# Patient Record
Sex: Female | Born: 1945
Health system: Southern US, Community
[De-identification: ages and names within clinical notes are randomized; demographics above are authoritative.]

## PROBLEM LIST (undated history)

## (undated) DIAGNOSIS — I10 Essential (primary) hypertension: Secondary | ICD-10-CM

## (undated) DIAGNOSIS — E119 Type 2 diabetes mellitus without complications: Secondary | ICD-10-CM

## (undated) DIAGNOSIS — Z87442 Personal history of urinary calculi: Secondary | ICD-10-CM

## (undated) DIAGNOSIS — Z9889 Other specified postprocedural states: Secondary | ICD-10-CM

## (undated) DIAGNOSIS — R112 Nausea with vomiting, unspecified: Secondary | ICD-10-CM

## (undated) DIAGNOSIS — N189 Chronic kidney disease, unspecified: Secondary | ICD-10-CM

## (undated) HISTORY — PX: TUBAL LIGATION: SHX77

---

## 2012-11-24 ENCOUNTER — Other Ambulatory Visit: Payer: Self-pay | Admitting: Internal Medicine

## 2012-11-24 DIAGNOSIS — Z1231 Encounter for screening mammogram for malignant neoplasm of breast: Secondary | ICD-10-CM

## 2012-12-21 ENCOUNTER — Ambulatory Visit
Admission: RE | Admit: 2012-12-21 | Discharge: 2012-12-21 | Disposition: A | Discharge status: Home / Self Care | Payer: Medicare HMO | Source: Ambulatory Visit | Attending: Internal Medicine | Admitting: Internal Medicine

## 2012-12-21 DIAGNOSIS — Z1231 Encounter for screening mammogram for malignant neoplasm of breast: Secondary | ICD-10-CM

## 2013-03-13 ENCOUNTER — Other Ambulatory Visit: Payer: Self-pay | Admitting: Gastroenterology

## 2013-04-21 ENCOUNTER — Encounter (HOSPITAL_COMMUNITY): Payer: Self-pay | Admitting: *Deleted

## 2013-04-25 ENCOUNTER — Encounter (HOSPITAL_COMMUNITY): Payer: Self-pay | Admitting: Pharmacy Technician

## 2013-05-16 ENCOUNTER — Ambulatory Visit (HOSPITAL_COMMUNITY)
Admission: RE | Admit: 2013-05-16 | Discharge: 2013-05-16 | Disposition: A | Discharge status: Home / Self Care | Payer: Medicare HMO | Source: Ambulatory Visit | Attending: Gastroenterology | Admitting: Gastroenterology

## 2013-05-16 ENCOUNTER — Encounter (HOSPITAL_COMMUNITY): Payer: Self-pay | Admitting: *Deleted

## 2013-05-16 ENCOUNTER — Encounter (HOSPITAL_COMMUNITY)
Admission: RE | Disposition: A | Discharge status: Home / Self Care | Payer: Self-pay | Source: Ambulatory Visit | Attending: Gastroenterology

## 2013-05-16 ENCOUNTER — Ambulatory Visit (HOSPITAL_COMMUNITY): Payer: Medicare HMO | Admitting: Anesthesiology

## 2013-05-16 ENCOUNTER — Encounter (HOSPITAL_COMMUNITY): Payer: Self-pay | Admitting: Anesthesiology

## 2013-05-16 DIAGNOSIS — K5730 Diverticulosis of large intestine without perforation or abscess without bleeding: Secondary | ICD-10-CM | POA: Insufficient documentation

## 2013-05-16 DIAGNOSIS — Z1211 Encounter for screening for malignant neoplasm of colon: Secondary | ICD-10-CM | POA: Insufficient documentation

## 2013-05-16 DIAGNOSIS — E119 Type 2 diabetes mellitus without complications: Secondary | ICD-10-CM | POA: Insufficient documentation

## 2013-05-16 DIAGNOSIS — I10 Essential (primary) hypertension: Secondary | ICD-10-CM | POA: Insufficient documentation

## 2013-05-16 HISTORY — DX: Other specified postprocedural states: Z98.890

## 2013-05-16 HISTORY — DX: Chronic kidney disease, unspecified: N18.9

## 2013-05-16 HISTORY — DX: Essential (primary) hypertension: I10

## 2013-05-16 HISTORY — DX: Personal history of urinary calculi: Z87.442

## 2013-05-16 HISTORY — DX: Nausea with vomiting, unspecified: R11.2

## 2013-05-16 HISTORY — DX: Type 2 diabetes mellitus without complications: E11.9

## 2013-05-16 HISTORY — PX: COLONOSCOPY: SHX5424

## 2013-05-16 SURGERY — COLONOSCOPY
Anesthesia: Monitor Anesthesia Care

## 2013-05-16 MED ORDER — KETAMINE HCL 50 MG/ML IJ SOLN
INTRAMUSCULAR | Status: DC | PRN
  Administered 2013-05-16: 25 mg via INTRAMUSCULAR

## 2013-05-16 MED ORDER — PROPOFOL 10 MG/ML IV EMUL
INTRAVENOUS | Status: DC | PRN
  Administered 2013-05-16: 30 mg via INTRAVENOUS

## 2013-05-16 MED ORDER — FENTANYL CITRATE 0.05 MG/ML IJ SOLN: 25.0000 ug | INTRAMUSCULAR | Status: DC | PRN

## 2013-05-16 MED ORDER — LACTATED RINGERS IV SOLN
INTRAVENOUS | Status: DC
  Administered 2013-05-16: 1000 mL via INTRAVENOUS

## 2013-05-16 MED ORDER — LACTATED RINGERS IV SOLN
INTRAVENOUS | Status: DC | PRN
  Administered 2013-05-16: 08:00:00 via INTRAVENOUS

## 2013-05-16 MED ORDER — SODIUM CHLORIDE 0.9 % IV SOLN: INTRAVENOUS | Status: DC

## 2013-05-16 MED ORDER — PROPOFOL INFUSION 10 MG/ML OPTIME
INTRAVENOUS | Status: DC | PRN
  Administered 2013-05-16: 100 ug/kg/min via INTRAVENOUS

## 2013-05-16 NOTE — Preoperative (Signed)
Beta Blockers   Reason not to administer Beta Blockers:Not Applicable 

## 2013-05-16 NOTE — Anesthesia Preprocedure Evaluation (Addendum)
Anesthesia Evaluation  Patient identified by MRN, date of birth, ID band Patient awake    Reviewed: Allergy & Precautions, H&P , NPO status , Patient's Chart, lab work & pertinent test results, reviewed documented beta blocker date and time   History of Anesthesia Complications (+) PONV  Airway Mallampati: II TM Distance: >3 FB Neck ROM: full    Dental no notable dental hx. (+) Teeth Intact and Dental Advisory Given   Pulmonary neg pulmonary ROS,  breath sounds clear to auscultation  Pulmonary exam normal       Cardiovascular Exercise Tolerance: Good hypertension, Pt. on medications Rhythm:regular Rate:Normal     Neuro/Psych negative neurological ROS  negative psych ROS   GI/Hepatic negative GI ROS, Neg liver ROS,   Endo/Other  diabetes, Well Controlled, Type 2, Oral Hypoglycemic Agents  Renal/GU negative Renal ROS  negative genitourinary   Musculoskeletal   Abdominal   Peds  Hematology negative hematology ROS (+)   Anesthesia Other Findings   Reproductive/Obstetrics negative OB ROS                          Anesthesia Physical Anesthesia Plan  ASA: III  Anesthesia Plan: MAC   Post-op Pain Management:    Induction:   Airway Management Planned: Simple Face Mask  Additional Equipment:   Intra-op Plan:   Post-operative Plan:   Informed Consent: I have reviewed the patients History and Physical, chart, labs and discussed the procedure including the risks, benefits and alternatives for the proposed anesthesia with the patient or authorized representative who has indicated his/her understanding and acceptance.   Dental Advisory Given  Plan Discussed with: CRNA and Surgeon  Anesthesia Plan Comments:         Anesthesia Quick Evaluation

## 2013-05-16 NOTE — H&P (Signed)
  Procedure: Baseline screening colonoscopy  History: The patient is a 67 year old female born 09-11-1946. She is scheduled to undergo her first screening colonoscopy with polypectomy to prevent colon cancer.  Medication allergies: Penicillin caused rash. Codeine caused lightheadedness.  Past medical history: Hypertension. Type 2 diabetes mellitus. Tubal ligation.  Habits: The patient has never smoked cigarettes. She does not consume alcohol.  Exam: The patient is alert and lying comfortably on the endoscopy stretcher. Cardiac exam reveals a regular rhythm. Lungs are clear to auscultation. Abdomen is soft and nontender to palpation.  Plan: Proceed with baseline screening colonoscopy

## 2013-05-16 NOTE — Transfer of Care (Signed)
Immediate Anesthesia Transfer of Care Note  Patient: Wendy Fields  Procedure(s) Performed: Procedure(s): COLONOSCOPY (N/A)  Patient Location: PACU  Anesthesia Type:MAC  Level of Consciousness: awake, alert  and oriented  Airway & Oxygen Therapy: Patient Spontanous Breathing and Patient connected to face mask oxygen  Post-op Assessment: Report given to PACU RN and Post -op Vital signs reviewed and stable  Post vital signs: Reviewed and stable  Complications: No apparent anesthesia complications

## 2013-05-16 NOTE — Anesthesia Postprocedure Evaluation (Addendum)
  Anesthesia Post-op Note  Patient: Wendy Fields  Procedure(s) Performed: Procedure(s) (LRB): COLONOSCOPY (N/A)  Patient Location: PACU  Anesthesia Type: MAC  Level of Consciousness: awake and alert   Airway and Oxygen Therapy: Patient Spontanous Breathing  Post-op Pain: mild  Post-op Assessment: Post-op Vital signs reviewed, Patient's Cardiovascular Status Stable, Respiratory Function Stable, Patent Airway and No signs of Nausea or vomiting  Last Vitals:  Filed Vitals:   05/16/13 0932  BP: 169/72  Temp:   Resp: 21    Post-op Vital Signs: stable   Complications: No apparent anesthesia complications

## 2013-05-16 NOTE — Op Note (Signed)
Procedure: Baseline screening colonoscopy  Endoscopist: Danise Edge  Premedication: Propofol administered by anesthesia  Procedure: The patient was placed in the left lateral decubitus position. Anal inspection and digital rectal exam were normal. The Pentax pediatric colonoscope was introduced into the rectum and advanced to the cecum. A normal-appearing appendiceal orifice and ileocecal valve were identified. Colonic preparation for the exam today was good.  Rectum. Normal. Retroflexed view of the distal rectum normal.  Sigmoid colon and descending colon. Left colonic diverticulosis  Splenic flexure. Normal.  Transverse colon. Normal.  Hepatic flexure. Normal.  Ascending colon. Normal.  Cecum and ileocecal valve. Normal.  Assessment: Normal baseline screening colonoscopy  Recommendations: Schedule repeat screening colonoscopy in 10 years.

## 2013-05-17 ENCOUNTER — Encounter (HOSPITAL_COMMUNITY): Payer: Self-pay | Admitting: Gastroenterology

## 2014-01-05 ENCOUNTER — Other Ambulatory Visit: Payer: Self-pay | Admitting: Internal Medicine

## 2014-01-05 DIAGNOSIS — Z1231 Encounter for screening mammogram for malignant neoplasm of breast: Secondary | ICD-10-CM

## 2014-01-26 ENCOUNTER — Ambulatory Visit
Admission: RE | Admit: 2014-01-26 | Discharge: 2014-01-26 | Disposition: A | Discharge status: Home / Self Care | Payer: Medicare HMO | Source: Ambulatory Visit | Attending: Internal Medicine | Admitting: Internal Medicine

## 2014-01-26 ENCOUNTER — Encounter (INDEPENDENT_AMBULATORY_CARE_PROVIDER_SITE_OTHER): Payer: Self-pay

## 2014-01-26 DIAGNOSIS — Z1231 Encounter for screening mammogram for malignant neoplasm of breast: Secondary | ICD-10-CM

## 2015-06-25 ENCOUNTER — Encounter (HOSPITAL_COMMUNITY): Payer: Self-pay | Admitting: Emergency Medicine

## 2015-06-25 ENCOUNTER — Emergency Department (HOSPITAL_COMMUNITY)
Admission: EM | Admit: 2015-06-25 | Discharge: 2015-06-25 | Disposition: A | Discharge status: Home / Self Care | Payer: Medicare PPO | Attending: Emergency Medicine | Admitting: Emergency Medicine

## 2015-06-25 DIAGNOSIS — R109 Unspecified abdominal pain: Secondary | ICD-10-CM | POA: Diagnosis present

## 2015-06-25 DIAGNOSIS — E119 Type 2 diabetes mellitus without complications: Secondary | ICD-10-CM | POA: Diagnosis not present

## 2015-06-25 DIAGNOSIS — Z79899 Other long term (current) drug therapy: Secondary | ICD-10-CM | POA: Insufficient documentation

## 2015-06-25 DIAGNOSIS — N23 Unspecified renal colic: Secondary | ICD-10-CM

## 2015-06-25 DIAGNOSIS — Z87442 Personal history of urinary calculi: Secondary | ICD-10-CM | POA: Insufficient documentation

## 2015-06-25 DIAGNOSIS — Z7982 Long term (current) use of aspirin: Secondary | ICD-10-CM | POA: Insufficient documentation

## 2015-06-25 DIAGNOSIS — Z880 Allergy status to penicillin: Secondary | ICD-10-CM | POA: Diagnosis not present

## 2015-06-25 DIAGNOSIS — N189 Chronic kidney disease, unspecified: Secondary | ICD-10-CM | POA: Insufficient documentation

## 2015-06-25 DIAGNOSIS — I129 Hypertensive chronic kidney disease with stage 1 through stage 4 chronic kidney disease, or unspecified chronic kidney disease: Secondary | ICD-10-CM | POA: Insufficient documentation

## 2015-06-25 LAB — CBC WITH DIFFERENTIAL/PLATELET
Basophils Absolute: 0 10*3/uL (ref 0.0–0.1)
Basophils Relative: 0 %
EOS PCT: 0 %
Eosinophils Absolute: 0 10*3/uL (ref 0.0–0.7)
HEMATOCRIT: 43.3 % (ref 36.0–46.0)
HEMOGLOBIN: 13.8 g/dL (ref 12.0–15.0)
LYMPHS ABS: 1.2 10*3/uL (ref 0.7–4.0)
LYMPHS PCT: 10 %
MCH: 29.9 pg (ref 26.0–34.0)
MCHC: 31.9 g/dL (ref 30.0–36.0)
MCV: 93.9 fL (ref 78.0–100.0)
Monocytes Absolute: 0.5 10*3/uL (ref 0.1–1.0)
Monocytes Relative: 4 %
NEUTROS ABS: 10.1 10*3/uL — AB (ref 1.7–7.7)
NEUTROS PCT: 86 %
Platelets: 241 10*3/uL (ref 150–400)
RBC: 4.61 MIL/uL (ref 3.87–5.11)
RDW: 13.5 % (ref 11.5–15.5)
WBC: 11.9 10*3/uL — AB (ref 4.0–10.5)

## 2015-06-25 LAB — COMPREHENSIVE METABOLIC PANEL
ALK PHOS: 64 U/L (ref 38–126)
ALT: 17 U/L (ref 14–54)
AST: 23 U/L (ref 15–41)
Albumin: 4.6 g/dL (ref 3.5–5.0)
Anion gap: 9 (ref 5–15)
BUN: 15 mg/dL (ref 6–20)
CALCIUM: 9.6 mg/dL (ref 8.9–10.3)
CO2: 27 mmol/L (ref 22–32)
CREATININE: 1.06 mg/dL — AB (ref 0.44–1.00)
Chloride: 104 mmol/L (ref 101–111)
GFR, EST NON AFRICAN AMERICAN: 53 mL/min — AB (ref >60)
Glucose, Bld: 183 mg/dL — ABNORMAL HIGH (ref 65–99)
Potassium: 3.5 mmol/L (ref 3.5–5.1)
Sodium: 140 mmol/L (ref 135–145)
Total Bilirubin: 0.8 mg/dL (ref 0.3–1.2)
Total Protein: 7.6 g/dL (ref 6.5–8.1)

## 2015-06-25 LAB — URINE MICROSCOPIC-ADD ON

## 2015-06-25 LAB — URINALYSIS, ROUTINE W REFLEX MICROSCOPIC
Bilirubin Urine: NEGATIVE
Glucose, UA: 250 mg/dL — AB
Ketones, ur: >80 mg/dL — AB
Nitrite: NEGATIVE
Protein, ur: NEGATIVE mg/dL
SPECIFIC GRAVITY, URINE: 1.016 (ref 1.005–1.030)
UROBILINOGEN UA: 0.2 mg/dL (ref 0.0–1.0)
pH: 7.5 (ref 5.0–8.0)

## 2015-06-25 MED ORDER — HYDROCODONE-ACETAMINOPHEN 5-325 MG PO TABS: ORAL_TABLET | ORAL | Status: DC

## 2015-06-25 MED ORDER — ONDANSETRON HCL 4 MG PO TABS: 4.0000 mg | ORAL_TABLET | Freq: Three times a day (TID) | ORAL | Status: DC | PRN

## 2015-06-25 NOTE — Discharge Instructions (Signed)
Please follow with your primary care doctor in the next 2 days for a check-up. They must obtain records for further management.  ° °Do not hesitate to return to the Emergency Department for any new, worsening or concerning symptoms.  ° °Take vicodin for breakthrough pain, do not drink alcohol, drive, care for children or do other critical tasks while taking vicodin. ° ° °Kidney Stones °Kidney stones (urolithiasis) are deposits that form inside your kidneys. The intense pain is caused by the stone moving through the urinary tract. When the stone moves, the ureter goes into spasm around the stone. The stone is usually passed in the urine.  °CAUSES  °· A disorder that makes certain neck glands produce too much parathyroid hormone (primary hyperparathyroidism). °· A buildup of uric acid crystals, similar to gout in your joints. °· Narrowing (stricture) of the ureter. °· A kidney obstruction present at birth (congenital obstruction). °· Previous surgery on the kidney or ureters. °· Numerous kidney infections. °SYMPTOMS  °· Feeling sick to your stomach (nauseous). °· Throwing up (vomiting). °· Blood in the urine (hematuria). °· Pain that usually spreads (radiates) to the groin. °· Frequency or urgency of urination. °DIAGNOSIS  °· Taking a history and physical exam. °· Blood or urine tests. °· CT scan. °· Occasionally, an examination of the inside of the urinary bladder (cystoscopy) is performed. °TREATMENT  °· Observation. °· Increasing your fluid intake. °· Extracorporeal shock wave lithotripsy--This is a noninvasive procedure that uses shock waves to break up kidney stones. °· Surgery may be needed if you have severe pain or persistent obstruction. There are various surgical procedures. Most of the procedures are performed with the use of small instruments. Only small incisions are needed to accommodate these instruments, so recovery time is minimized. °The size, location, and chemical composition are all important  variables that will determine the proper choice of action for you. Talk to your health care provider to better understand your situation so that you will minimize the risk of injury to yourself and your kidney.  °HOME CARE INSTRUCTIONS  °· Drink enough water and fluids to keep your urine clear or pale yellow. This will help you to pass the stone or stone fragments. °· Strain all urine through the provided strainer. Keep all particulate matter and stones for your health care provider to see. The stone causing the pain may be as small as a grain of salt. It is very important to use the strainer each and every time you pass your urine. The collection of your stone will allow your health care provider to analyze it and verify that a stone has actually passed. The stone analysis will often identify what you can do to reduce the incidence of recurrences. °· Only take over-the-counter or prescription medicines for pain, discomfort, or fever as directed by your health care provider. °· Make a follow-up appointment with your health care provider as directed. °· Get follow-up X-rays if required. The absence of pain does not always mean that the stone has passed. It may have only stopped moving. If the urine remains completely obstructed, it can cause loss of kidney function or even complete destruction of the kidney. It is your responsibility to make sure X-rays and follow-ups are completed. Ultrasounds of the kidney can show blockages and the status of the kidney. Ultrasounds are not associated with any radiation and can be performed easily in a matter of minutes. °SEEK MEDICAL CARE IF: °· You experience pain that is progressive and unresponsive   to any pain medicine you have been prescribed. °SEEK IMMEDIATE MEDICAL CARE IF:  °· Pain cannot be controlled with the prescribed medicine. °· You have a fever or shaking chills. °· The severity or intensity of pain increases over 18 hours and is not relieved by pain medicine. °· You  develop a new onset of abdominal pain. °· You feel faint or pass out. °· You are unable to urinate. °MAKE SURE YOU:  °· Understand these instructions. °· Will watch your condition. °· Will get help right away if you are not doing well or get worse. °Document Released: 09/07/2005 Document Revised: 05/10/2013 Document Reviewed: 02/08/2013 °[employer]® Patient Information ©2015 [employer]. This information is not intended to replace advice given to you by your health care provider. Make sure you discuss any questions you have with your health care provider. ° °

## 2015-06-25 NOTE — ED Notes (Signed)
Pt st's she had sudden onset of left flank pain radiating around to LLQ approx 2 pm today with nausea and vomiting.  Pt st's pain has subsided at this time.  Has hx of kidney stones

## 2015-06-25 NOTE — ED Provider Notes (Signed)
  Face-to-face evaluation   History: Patient had left flank pain typical of her usual discomfort with a kidney stone. The pain radiated to the left abdomen. Pain is completely resolved now. She has no other symptoms.  Physical exam: Alert, calm, comfortable, in no apparent distress. Back is nontender to palpation. She has normal range of motion of the back.    Medical screening examination/treatment/procedure(s) were conducted as a shared visit with non-physician practitioner(s) and myself.  I personally evaluated the patient during the encounter  Mancel Bale, MD 06/25/15 [phone]

## 2015-06-25 NOTE — ED Provider Notes (Signed)
CSN: [id number]     Arrival date & time 06/25/15  1537 History  By signing my name below, I, Ronney Lion, attest that this documentation has been prepared under the direction and in the presence of [employer], PA-C. Electronically Signed: Ronney Lion, ED Scribe. 06/25/2015. 5:54 PM.      Chief Complaint  Patient presents with  . Flank Pain   The history is provided by the patient. No language interpreter was used.    HPI Comments: Wendy Fields is a 69 y.o. female with a history of HTN, DM, history of kidney stones, who presents to the Emergency Department complaining of sudden-onset, constant, waxing and waning left flank pain that began about 4 hours ago. She rates her pain as a 2/10 at the present moment but a 7/10 at its highest. Patient also complains of associated nausea and vomiting because her pain was so severe. She states this feels like a kidney stone although she notes the last kidney stone she passed was 20 years ago. She states she was able to pass her kidney stones without any lithotripsy or medical interventions in the past. Patient had seen a urologist 20 years ago but has not seen one since her last stone. Patient states she does not feel as if she passed the stone yet. She denies hematuria, fever, dysuria, chest pain, SOB, or abdominal pain. Patient has known allergies to penicillin and codeine, which she states causes a "fuzzy-headed experience."   Past Medical History  Diagnosis Date  . Hypertension   . Diabetes mellitus without complication (HCC)   . History of kidney stones 10 to 15 yrs ago  . PONV (postoperative nausea and vomiting)     nausea only  . Chronic kidney disease     hx  kidney stone   Past Surgical History  Procedure Laterality Date  . Tubal ligation  yrs ago  . Colonoscopy N/A 05/16/2013    Procedure: COLONOSCOPY;  Surgeon: Charolett Bumpers, MD;  Location: [facility];  Service: Endoscopy;  Laterality: N/A;   No family history on file. Social  History  Substance Use Topics  . Smoking status: Never Smoker   . Smokeless tobacco: Never Used  . Alcohol Use: No   OB History    No data available     Review of Systems  A complete 10 system review of systems was obtained and all systems are negative except as noted in the HPI and PMH.    Allergies  Codeine and Penicillins  Home Medications   Prior to Admission medications   Medication Sig Start Date End Date Taking? Authorizing Provider  aspirin EC 81 MG tablet Take 81 mg by mouth daily.    Historical Provider, MD  Biotin (BIOTIN 5000) 5 MG CAPS Take 1 capsule by mouth daily.    Historical Provider, MD  calcium-vitamin D (OSCAL WITH D) 500-200 MG-UNIT per tablet Take 1 tablet by mouth 2 (two) times daily.    Historical Provider, MD  HYDROcodone-acetaminophen (NORCO/VICODIN) 5-325 MG tablet Take 1-2 tablets by mouth every 6 hours as needed for pain and/or cough. 06/25/15   Mateo Overbeck, PA-C  losartan (COZAAR) 50 MG tablet Take 50 mg by mouth every evening.    Historical Provider, MD  metFORMIN (GLUCOPHAGE) 500 MG tablet Take 500 mg by mouth every evening.    Historical Provider, MD  ondansetron (ZOFRAN) 4 MG tablet Take 1 tablet (4 mg total) by mouth every 8 (eight) hours as needed for nausea or vomiting.  06/25/15   Kamylle Axelson, PA-C  vitamin E 400 UNIT capsule Take 400 Units by mouth daily.    Historical Provider, MD   BP 119/55 mmHg  Pulse 64  Temp(Src) 97.9 F (36.6 C) (Oral)  Resp 18  SpO2 94% Physical Exam  Constitutional: She is oriented to person, place, and time. She appears well-developed and well-nourished. No distress.  HENT:  Head: Normocephalic and atraumatic.  Mouth/Throat: Oropharynx is clear and moist.  Eyes: Conjunctivae and EOM are normal. Pupils are equal, round, and reactive to light.  Neck: Normal range of motion. Neck supple. No tracheal deviation present.  Cardiovascular: Normal rate, regular rhythm and intact distal pulses.    Pulmonary/Chest: Effort normal and breath sounds normal. No stridor. No respiratory distress.  Abdominal: Soft. Bowel sounds are normal. She exhibits no distension and no mass. There is no tenderness. There is no rebound and no guarding.  Genitourinary:  No CVA tenderness to percussion bilaterally  Musculoskeletal: Normal range of motion.  Neurological: She is alert and oriented to person, place, and time.  Skin: Skin is warm and dry. She is not diaphoretic.  Psychiatric: She has a normal mood and affect. Her behavior is normal.  Nursing note and vitals reviewed.   ED Course  Procedures (including critical care time)  DIAGNOSTIC STUDIES: Oxygen Saturation is 99% on RA, normal by my interpretation.    COORDINATION OF CARE: 5:54 PM - Discussed treatment plan with pt at bedside which includes Rx pain medication and nausea medication upon discharge. Pt declines any pain medication while here. Return precautions given. Pt verbalized understanding and agreed to plan.   Labs Review Labs Reviewed  URINALYSIS, ROUTINE W REFLEX MICROSCOPIC (NOT AT [facility]) - Abnormal; Notable for the following:    APPearance HAZY (*)    Glucose, UA 250 (*)    Hgb urine dipstick MODERATE (*)    Ketones, ur >80 (*)    Leukocytes, UA TRACE (*)    All other components within normal limits  CBC WITH DIFFERENTIAL/PLATELET - Abnormal; Notable for the following:    WBC 11.9 (*)    Neutro Abs 10.1 (*)    All other components within normal limits  COMPREHENSIVE METABOLIC PANEL - Abnormal; Notable for the following:    Glucose, Bld 183 (*)    Creatinine, Ser 1.06 (*)    GFR calc non Af Amer 53 (*)    All other components within normal limits  URINE MICROSCOPIC-ADD ON - Abnormal; Notable for the following:    Squamous Epithelial / LPF FEW (*)    Bacteria, UA FEW (*)    All other components within normal limits    MDM   Final diagnoses:  Renal colic on left side    Filed Vitals:   06/25/15 1614 06/25/15  1814  BP: 115/52 119/55  Pulse: 68 64  Temp: 97.9 F (36.6 C)   TempSrc: Oral   Resp: 20 18  SpO2: 99% 94%     Wendy Fields is a pleasant 69 y.o. female presenting with left flank pain radiating around to the anterior side was severe, causing her to vomit. This is consistent with her prior episodes of kidney stones. She's never required any intervention to pass the stones and the pain has largely resolved at this point. This is likely a stone that has passed. there is any indication for imaging at this time. Urinalysis is not consistent with infection, patient declines any pain or nausea medication. Creatinine is slightly elevated at 1.06.  Ketones in the urine probably secondary to emesis. She is hyperglycemic with a normal anion gap. Patient will be written a prescription for Zofran and Vicodin if the pain is to continue. Patient is given a urine strainer and urology follow-up.  This is a shared visit with the attending physician who personally evaluated the patient and agrees with the care plan.   Evaluation does not show pathology that would require ongoing emergent intervention or inpatient treatment. Pt is hemodynamically stable and mentating appropriately. Discussed findings and plan with patient/guardian, who agrees with care plan. All questions answered. Return precautions discussed and outpatient follow up given.   New Prescriptions   HYDROCODONE-ACETAMINOPHEN (NORCO/VICODIN) 5-325 MG TABLET    Take 1-2 tablets by mouth every 6 hours as needed for pain and/or cough.   ONDANSETRON (ZOFRAN) 4 MG TABLET    Take 1 tablet (4 mg total) by mouth every 8 (eight) hours as needed for nausea or vomiting.     I personally performed the services described in this documentation, which was scribed in my presence. The recorded information has been reviewed and is accurate.     Wynetta Emery, PA-C 06/25/15 1826  Mancel Bale, MD 06/25/15 [phone]

## 2015-07-09 DIAGNOSIS — N200 Calculus of kidney: Secondary | ICD-10-CM | POA: Diagnosis not present

## 2015-07-09 DIAGNOSIS — E1122 Type 2 diabetes mellitus with diabetic chronic kidney disease: Secondary | ICD-10-CM | POA: Diagnosis not present

## 2015-07-09 DIAGNOSIS — I10 Essential (primary) hypertension: Secondary | ICD-10-CM | POA: Diagnosis not present

## 2015-07-09 DIAGNOSIS — Z23 Encounter for immunization: Secondary | ICD-10-CM | POA: Diagnosis not present

## 2015-07-09 DIAGNOSIS — M810 Age-related osteoporosis without current pathological fracture: Secondary | ICD-10-CM | POA: Diagnosis not present

## 2015-07-09 DIAGNOSIS — E785 Hyperlipidemia, unspecified: Secondary | ICD-10-CM | POA: Diagnosis not present

## 2015-07-09 DIAGNOSIS — N182 Chronic kidney disease, stage 2 (mild): Secondary | ICD-10-CM | POA: Diagnosis not present

## 2015-08-20 DIAGNOSIS — N201 Calculus of ureter: Secondary | ICD-10-CM | POA: Diagnosis not present

## 2016-01-08 ENCOUNTER — Other Ambulatory Visit: Payer: Self-pay | Admitting: Internal Medicine

## 2016-01-08 DIAGNOSIS — Z1231 Encounter for screening mammogram for malignant neoplasm of breast: Secondary | ICD-10-CM

## 2016-01-27 ENCOUNTER — Ambulatory Visit
Admission: RE | Admit: 2016-01-27 | Discharge: 2016-01-27 | Disposition: A | Discharge status: Home / Self Care | Payer: Medicare Other | Source: Ambulatory Visit | Attending: Internal Medicine | Admitting: Internal Medicine

## 2016-01-27 DIAGNOSIS — Z1231 Encounter for screening mammogram for malignant neoplasm of breast: Secondary | ICD-10-CM

## 2018-02-21 ENCOUNTER — Other Ambulatory Visit: Payer: Self-pay | Admitting: Internal Medicine

## 2018-02-21 DIAGNOSIS — Z1231 Encounter for screening mammogram for malignant neoplasm of breast: Secondary | ICD-10-CM

## 2018-03-18 ENCOUNTER — Ambulatory Visit
Admission: RE | Admit: 2018-03-18 | Discharge: 2018-03-18 | Disposition: A | Discharge status: Home / Self Care | Payer: Medicare Other | Source: Ambulatory Visit | Attending: Internal Medicine | Admitting: Internal Medicine

## 2018-03-18 ENCOUNTER — Ambulatory Visit: Payer: Self-pay

## 2018-03-18 DIAGNOSIS — Z1231 Encounter for screening mammogram for malignant neoplasm of breast: Secondary | ICD-10-CM

## 2019-03-22 ENCOUNTER — Other Ambulatory Visit: Payer: Self-pay | Admitting: Internal Medicine

## 2019-03-22 DIAGNOSIS — M810 Age-related osteoporosis without current pathological fracture: Secondary | ICD-10-CM

## 2019-03-23 ENCOUNTER — Other Ambulatory Visit: Payer: Self-pay | Admitting: Internal Medicine

## 2019-03-23 DIAGNOSIS — Z1231 Encounter for screening mammogram for malignant neoplasm of breast: Secondary | ICD-10-CM

## 2019-06-21 ENCOUNTER — Ambulatory Visit
Admission: RE | Admit: 2019-06-21 | Discharge: 2019-06-21 | Disposition: A | Discharge status: Home / Self Care | Payer: Medicare Other | Source: Ambulatory Visit | Attending: Internal Medicine | Admitting: Internal Medicine

## 2019-06-21 ENCOUNTER — Other Ambulatory Visit: Payer: Self-pay

## 2019-06-21 DIAGNOSIS — Z1231 Encounter for screening mammogram for malignant neoplasm of breast: Secondary | ICD-10-CM

## 2019-06-21 DIAGNOSIS — M810 Age-related osteoporosis without current pathological fracture: Secondary | ICD-10-CM

## 2020-07-08 DIAGNOSIS — J209 Acute bronchitis, unspecified: Secondary | ICD-10-CM | POA: Diagnosis not present

## 2020-07-08 DIAGNOSIS — R051 Acute cough: Secondary | ICD-10-CM | POA: Diagnosis not present

## 2020-07-08 DIAGNOSIS — J069 Acute upper respiratory infection, unspecified: Secondary | ICD-10-CM | POA: Diagnosis not present

## 2020-07-08 DIAGNOSIS — Z20828 Contact with and (suspected) exposure to other viral communicable diseases: Secondary | ICD-10-CM | POA: Diagnosis not present

## 2020-07-08 DIAGNOSIS — E1165 Type 2 diabetes mellitus with hyperglycemia: Secondary | ICD-10-CM | POA: Diagnosis not present

## 2020-07-30 DIAGNOSIS — Z20828 Contact with and (suspected) exposure to other viral communicable diseases: Secondary | ICD-10-CM | POA: Diagnosis not present

## 2020-08-27 DIAGNOSIS — E119 Type 2 diabetes mellitus without complications: Secondary | ICD-10-CM | POA: Diagnosis not present

## 2020-09-02 DIAGNOSIS — M810 Age-related osteoporosis without current pathological fracture: Secondary | ICD-10-CM | POA: Diagnosis not present

## 2020-09-02 DIAGNOSIS — R059 Cough, unspecified: Secondary | ICD-10-CM | POA: Diagnosis not present

## 2020-09-02 DIAGNOSIS — N182 Chronic kidney disease, stage 2 (mild): Secondary | ICD-10-CM | POA: Diagnosis not present

## 2020-09-02 DIAGNOSIS — E1122 Type 2 diabetes mellitus with diabetic chronic kidney disease: Secondary | ICD-10-CM | POA: Diagnosis not present

## 2020-09-02 DIAGNOSIS — Z7984 Long term (current) use of oral hypoglycemic drugs: Secondary | ICD-10-CM | POA: Diagnosis not present

## 2020-09-02 DIAGNOSIS — I10 Essential (primary) hypertension: Secondary | ICD-10-CM | POA: Diagnosis not present

## 2020-10-28 DIAGNOSIS — Z20828 Contact with and (suspected) exposure to other viral communicable diseases: Secondary | ICD-10-CM | POA: Diagnosis not present

## 2021-03-24 DIAGNOSIS — J209 Acute bronchitis, unspecified: Secondary | ICD-10-CM | POA: Diagnosis not present

## 2021-03-24 DIAGNOSIS — R059 Cough, unspecified: Secondary | ICD-10-CM | POA: Diagnosis not present

## 2021-03-24 DIAGNOSIS — J0100 Acute maxillary sinusitis, unspecified: Secondary | ICD-10-CM | POA: Diagnosis not present

## 2021-04-21 ENCOUNTER — Other Ambulatory Visit: Payer: Self-pay | Admitting: Internal Medicine

## 2021-04-21 DIAGNOSIS — Z1231 Encounter for screening mammogram for malignant neoplasm of breast: Secondary | ICD-10-CM

## 2021-04-24 ENCOUNTER — Other Ambulatory Visit: Payer: Self-pay

## 2021-04-24 ENCOUNTER — Ambulatory Visit
Admission: RE | Admit: 2021-04-24 | Discharge: 2021-04-24 | Disposition: A | Discharge status: Home / Self Care | Payer: Medicare PPO | Source: Ambulatory Visit | Attending: Internal Medicine | Admitting: Internal Medicine

## 2021-04-24 DIAGNOSIS — Z1231 Encounter for screening mammogram for malignant neoplasm of breast: Secondary | ICD-10-CM

## 2021-04-28 ENCOUNTER — Other Ambulatory Visit: Payer: Self-pay | Admitting: Internal Medicine

## 2021-04-28 DIAGNOSIS — R928 Other abnormal and inconclusive findings on diagnostic imaging of breast: Secondary | ICD-10-CM

## 2021-05-13 ENCOUNTER — Ambulatory Visit
Admission: RE | Admit: 2021-05-13 | Discharge: 2021-05-13 | Disposition: A | Discharge status: Home / Self Care | Payer: Medicare PPO | Source: Ambulatory Visit | Attending: Internal Medicine | Admitting: Internal Medicine

## 2021-05-13 ENCOUNTER — Other Ambulatory Visit: Payer: Self-pay

## 2021-05-13 ENCOUNTER — Ambulatory Visit: Payer: Medicare PPO

## 2021-05-13 DIAGNOSIS — R928 Other abnormal and inconclusive findings on diagnostic imaging of breast: Secondary | ICD-10-CM

## 2021-05-13 DIAGNOSIS — R922 Inconclusive mammogram: Secondary | ICD-10-CM | POA: Diagnosis not present

## 2021-05-20 DIAGNOSIS — I10 Essential (primary) hypertension: Secondary | ICD-10-CM | POA: Diagnosis not present

## 2021-05-20 DIAGNOSIS — Z1389 Encounter for screening for other disorder: Secondary | ICD-10-CM | POA: Diagnosis not present

## 2021-05-20 DIAGNOSIS — N182 Chronic kidney disease, stage 2 (mild): Secondary | ICD-10-CM | POA: Diagnosis not present

## 2021-05-20 DIAGNOSIS — R059 Cough, unspecified: Secondary | ICD-10-CM | POA: Diagnosis not present

## 2021-05-20 DIAGNOSIS — Z0000 Encounter for general adult medical examination without abnormal findings: Secondary | ICD-10-CM | POA: Diagnosis not present

## 2021-05-20 DIAGNOSIS — E1122 Type 2 diabetes mellitus with diabetic chronic kidney disease: Secondary | ICD-10-CM | POA: Diagnosis not present

## 2021-05-20 DIAGNOSIS — M810 Age-related osteoporosis without current pathological fracture: Secondary | ICD-10-CM | POA: Diagnosis not present

## 2021-05-20 DIAGNOSIS — E7800 Pure hypercholesterolemia, unspecified: Secondary | ICD-10-CM | POA: Diagnosis not present

## 2021-06-20 ENCOUNTER — Institutional Professional Consult (permissible substitution): Payer: Medicare PPO | Admitting: Emergency Medicine

## 2021-07-04 ENCOUNTER — Encounter: Payer: Self-pay | Admitting: Pulmonary Disease

## 2021-07-04 ENCOUNTER — Ambulatory Visit (INDEPENDENT_AMBULATORY_CARE_PROVIDER_SITE_OTHER): Payer: Medicare PPO

## 2021-07-04 ENCOUNTER — Ambulatory Visit: Payer: Medicare PPO | Admitting: Pulmonary Disease

## 2021-07-04 ENCOUNTER — Other Ambulatory Visit: Payer: Self-pay

## 2021-07-04 VITALS — BP 124/80 | HR 74 | Temp 98.1°F | Ht 63.0 in | Wt 153.4 lb

## 2021-07-04 DIAGNOSIS — R053 Chronic cough: Secondary | ICD-10-CM

## 2021-07-04 MED ORDER — ALBUTEROL SULFATE HFA 108 (90 BASE) MCG/ACT IN AERS: 2.0000 | INHALATION_SPRAY | Freq: Four times a day (QID) | RESPIRATORY_TRACT | 6 refills | Status: AC | PRN

## 2021-07-04 MED ORDER — PSEUDOEPH-BROMPHEN-DM 30-2-10 MG/5ML PO SYRP: 5.0000 mL | ORAL_SOLUTION | Freq: Four times a day (QID) | ORAL | 0 refills | Status: DC | PRN

## 2021-07-04 NOTE — Patient Instructions (Signed)
Shortness of breath Wheezing --ARRANGE pulmonary function tests prior to next visit --START Albuterol TWO puffs AS NEEDED for shortness of breath or wheezing --Take mucinex twice a day for 2-3 days for chest congestion

## 2021-07-04 NOTE — Progress Notes (Signed)
Subjective:   PATIENT ID: Wendy Fields GENDER: female DOB: 10-08-1945, MRN: [id number]   HPI  Chief Complaint  Patient presents with   Consult    Pt has had a cough off and on since 07/2020. States her cough can come and go and can happen anytime throughout the day. Will occ cough up clear phlegm. Has had some wheezing.    Reason for Visit: New consult for chronic cough  [name] is a 75 year old female never smoker with CKD stage II, DM2, HLD, HTN, osteoporosis who was referred for chronic cough.  She was seen by her PCP 05/20/21. Note reviewed by Dr. Donette Larry. Chronic cough treated with z pack x 2 courses which improves it. Trial of PPI x 2 months without improvement.   She reports cough and wheezing since November 2021. Family history of asthma (mother and granddaughter). She does not have a formal diagnosis. She has not tried any inhalers but did receive one when she went to urgent care. She reports her cough is productive with thick sputum. Occasional wheezing noticed at night. Coughing occurs throughout the day. Her cough will improve after sputum clearance. Denies allergic rhinitis. Denies heartburn symptoms. Has tried mucinex DM intermittently for symptoms.  Social History: Never smoker  Environmental exposures:  Educator  I have personally reviewed patient's past medical/family/social history, allergies, current medications.  Past Medical History:  Diagnosis Date   Chronic kidney disease    hx  kidney stone   Diabetes mellitus without complication (HCC)    History of kidney stones 10 to 15 yrs ago   Hypertension    PONV (postoperative nausea and vomiting)    nausea only     Family History  Problem Relation Age of Onset   Asthma Mother     Social History   Occupational History   Not on file  Tobacco Use   Smoking status: Never   Smokeless tobacco: Never  Substance and Sexual Activity   Alcohol use: No   Drug use: No   Sexual activity: Not  on file    Allergies  Allergen Reactions   Codeine Other (See Comments)    "made my head fuzzy"   Penicillins Other (See Comments)    Whelps as child     Outpatient Medications Prior to Visit  Medication Sig Dispense Refill   alendronate (FOSAMAX) 70 MG tablet Take 70 mg by mouth once a week. Take with a full glass of water on an empty stomach.     Biotin 5 MG CAPS Take 1 capsule by mouth daily.     Cholecalciferol (VITAMIN D3) 50 MCG (2000 UT) TABS Take 1 tablet by mouth daily.     losartan (COZAAR) 50 MG tablet Take 50 mg by mouth every evening.     MAGNESIUM PO Take by mouth.     metFORMIN (GLUCOPHAGE) 500 MG tablet Take 500 mg by mouth every evening.     Multiple Vitamins-Minerals (ZINC PO) Take by mouth.     Pyridoxine HCl (VITAMIN B6 PO) Take by mouth.     vitamin E 400 UNIT capsule Take 400 Units by mouth daily.     aspirin EC 81 MG tablet Take 81 mg by mouth daily.     calcium-vitamin D (OSCAL WITH D) 500-200 MG-UNIT per tablet Take 1 tablet by mouth 2 (two) times daily.     HYDROcodone-acetaminophen (NORCO/VICODIN) 5-325 MG tablet Take 1-2 tablets by mouth every 6 hours as needed for pain and/or  cough. 7 tablet 0   ondansetron (ZOFRAN) 4 MG tablet Take 1 tablet (4 mg total) by mouth every 8 (eight) hours as needed for nausea or vomiting. 10 tablet 0   No facility-administered medications prior to visit.    Review of Systems  Constitutional:  Negative for chills, diaphoresis, fever, malaise/fatigue and weight loss.  HENT:  Negative for congestion, ear pain and sore throat.   Respiratory:  Positive for cough, sputum production and wheezing. Negative for hemoptysis and shortness of breath.   Cardiovascular:  Negative for chest pain, palpitations and leg swelling.  Gastrointestinal:  Negative for abdominal pain, heartburn and nausea.  Genitourinary:  Negative for frequency.  Musculoskeletal:  Negative for joint pain and myalgias.  Skin:  Negative for itching and rash.   Neurological:  Negative for dizziness, weakness and headaches.  Endo/Heme/Allergies:  Does not bruise/bleed easily.  Psychiatric/Behavioral:  Negative for depression. The patient is not nervous/anxious.     Objective:   Vitals:   07/04/21 1337  BP: 124/80  Pulse: 74  Temp: 98.1 F (36.7 C)  TempSrc: Oral  SpO2: 97%  Weight: 153 lb 6.4 oz (69.6 kg)  Height: 5\' 3"  (1.6 m)     Physical Exam: General: Well-appearing, no acute distress HENT: [place], AT Eyes: EOMI, no scleral icterus Respiratory: Clear to auscultation bilaterally.  No crackles, wheezing or rales Cardiovascular: RRR, -M/R/G, no JVD Extremities:-Edema,-tenderness Neuro: AAO x4, CNII-XII grossly intact Skin: Intact, no rashes or bruising Psych: Normal mood, normal affect  Data Reviewed:  Imaging: None of file  PFT: None on file  Labs: CBC    Component Value Date/Time   WBC 11.9 (H) 06/25/2015 1700   RBC 4.61 06/25/2015 1700   HGB 13.8 06/25/2015 1700   HCT 43.3 06/25/2015 1700   PLT 241 06/25/2015 1700   MCV 93.9 06/25/2015 1700   MCH 29.9 06/25/2015 1700   MCHC 31.9 06/25/2015 1700   RDW 13.5 06/25/2015 1700   LYMPHSABS 1.2 06/25/2015 1700   MONOABS 0.5 06/25/2015 1700   EOSABS 0.0 06/25/2015 1700   BASOSABS 0.0 06/25/2015 1700   Absolute eos 06/25/15 - 0    Assessment & Plan:   Discussion: 75 year old female never smoker who presents with chronic cough ~1 year associated with wheezing. Common causes of cough were discussed including upper airway cough syndrome, reflux and undiagnosed obstructive lung disease. Will schedule pulmonary function tests to evaluate lungs.  Chronic cough Shortness of breath Wheezing --ARRANGE pulmonary function tests prior to next visit --START Albuterol TWO puffs AS NEEDED for shortness of breath or wheezing --Take mucinex twice a day for 2-3 days for chest congestion --CXR ordered --PRN cough syrup ordered  Health Maintenance Immunization History   Administered Date(s) Administered   Moderna Sars-Covid-2 Vaccination 10/10/2019, 11/08/2019, 08/12/2020   Zoster Recombinat (Shingrix) 03/21/2019   CT Lung Screen- not indicated. Never smoker  Orders Placed This Encounter  Procedures   DG Chest 2 View    Standing Status:   Future    Number of Occurrences:   1    Standing Expiration Date:   07/04/2022    Order Specific Question:   Reason for Exam (SYMPTOM  OR DIAGNOSIS REQUIRED)    Answer:   chronic cough    Order Specific Question:   Preferred imaging location?    Answer:   Internal   Pulmonary function test    Standing Status:   Future    Standing Expiration Date:   07/04/2022    Order Specific  Question:   Where should this test be performed?    Answer:   [facility]    Order Specific Question:   Full PFT: includes the following: basic spirometry, spirometry pre & post bronchodilator, diffusion capacity (DLCO), lung volumes    Answer:   Full PFT   Meds ordered this encounter  Medications   brompheniramine-pseudoephedrine-DM 30-2-10 MG/5ML syrup    Sig: Take 5 mLs by mouth 4 (four) times daily as needed.    Dispense:  120 mL    Refill:  0   albuterol (VENTOLIN HFA) 108 (90 Base) MCG/ACT inhaler    Sig: Inhale 2 puffs into the lungs every 6 (six) hours as needed for wheezing or shortness of breath.    Dispense:  8 g    Refill:  6    Please prescribe inhaler covered by pt's insurance    No follow-ups on file.  I have spent a total time of 45-minutes on the day of the appointment reviewing prior documentation, coordinating care and discussing medical diagnosis and plan with the patient/family. Imaging, labs and tests included in this note have been reviewed and interpreted independently by me.  Arrin Ishler Mechele Collin, MD [facility] 07/04/2021 1:38 PM  Office Number [phone]

## 2021-07-07 ENCOUNTER — Encounter: Payer: Self-pay | Admitting: Pulmonary Disease

## 2021-07-07 ENCOUNTER — Telehealth: Payer: Self-pay | Admitting: Pulmonary Disease

## 2021-07-07 DIAGNOSIS — R053 Chronic cough: Secondary | ICD-10-CM

## 2021-07-07 NOTE — Telephone Encounter (Signed)
Called and discussed with patient findings on CXR. Will need CT chest without contrast to further examine.  Please schedule patient with CT chest without contrast and follow-up with me on same day during the first week of November

## 2021-07-07 NOTE — Telephone Encounter (Signed)
Order for CT has been placed. Also placed a note in there that pt will also need to have a f/u appt scheduled with JE same day as the CT

## 2021-07-07 NOTE — Telephone Encounter (Signed)
Received call report from [place] with [facility] on patient's chest xrasy done on 07/04/21. Dr. Everardo All please review the result/impression copied below:  IMPRESSION: 1. Prominent RIGHT perihilar contour. Findings could reflect summation artifact but underlying mass or lymphadenopathy remain in the differential. Recommend further evaluation with dedicated contrast enhanced chest CT. 2. Mild coarse reticulation peripherally could reflect underlying interstitial lung disease. Recommend attention on follow-up.   Please advise, thank you.

## 2021-07-11 ENCOUNTER — Telehealth: Payer: Self-pay | Admitting: *Deleted

## 2021-07-11 NOTE — Telephone Encounter (Signed)
ATC x1 to schedule f/u with Dr. Everardo All for 11/3 or 11/4 after CT scan on 11/2.  Left VM to retun call.

## 2021-07-23 ENCOUNTER — Ambulatory Visit (INDEPENDENT_AMBULATORY_CARE_PROVIDER_SITE_OTHER)
Admission: RE | Admit: 2021-07-23 | Discharge: 2021-07-23 | Disposition: A | Discharge status: Home / Self Care | Payer: Medicare PPO | Source: Ambulatory Visit | Attending: Pulmonary Disease | Admitting: Pulmonary Disease

## 2021-07-23 ENCOUNTER — Other Ambulatory Visit: Payer: Self-pay

## 2021-07-23 DIAGNOSIS — R053 Chronic cough: Secondary | ICD-10-CM

## 2021-07-23 DIAGNOSIS — R911 Solitary pulmonary nodule: Secondary | ICD-10-CM | POA: Diagnosis not present

## 2021-07-23 DIAGNOSIS — R918 Other nonspecific abnormal finding of lung field: Secondary | ICD-10-CM | POA: Diagnosis not present

## 2021-07-24 ENCOUNTER — Other Ambulatory Visit: Payer: Self-pay

## 2021-07-24 ENCOUNTER — Encounter: Payer: Self-pay | Admitting: Pulmonary Disease

## 2021-07-24 ENCOUNTER — Ambulatory Visit: Payer: Medicare PPO | Admitting: Pulmonary Disease

## 2021-07-24 VITALS — BP 122/70 | HR 70 | Temp 98.2°F | Ht 63.0 in | Wt 150.0 lb

## 2021-07-24 DIAGNOSIS — R053 Chronic cough: Secondary | ICD-10-CM | POA: Diagnosis not present

## 2021-07-24 MED ORDER — PSEUDOEPH-BROMPHEN-DM 30-2-10 MG/5ML PO SYRP: 5.0000 mL | ORAL_SOLUTION | Freq: Four times a day (QID) | ORAL | 2 refills | Status: AC | PRN

## 2021-07-24 NOTE — Progress Notes (Signed)
Subjective:   PATIENT ID: Wendy Fields GENDER: female DOB: 05-20-1946, MRN: [id number]   HPI  Chief Complaint  Patient presents with   Follow-up    Chronic cough Refill on cough syrup Nasal drainage    Reason for Visit: Follow-up  Wendy Fields is a 75 year old female never smoker with CKD stage II, DM2, HLD, HTN, osteoporosis who was referred for chronic cough.  She was seen by her PCP 05/20/21. Note reviewed by Dr. Donette Larry. Chronic cough treated with z pack x 2 courses which improves it. Trial of PPI x 2 months without improvement.   She reports cough and wheezing since November 2021. Family history of asthma (mother and granddaughter). She does not have a formal diagnosis. She has not tried any inhalers but did receive one when she went to urgent care. She reports her cough is productive with thick sputum. Occasional wheezing noticed at night. Coughing occurs throughout the day. Her cough will improve after sputum clearance. Denies allergic rhinitis. Denies heartburn symptoms. Has tried mucinex DM intermittently for symptoms.  07/24/21 Since our last visit, cough is better controlled on cough syrup. Uses albuterol twice a day Improved by 50%. Took mucinex daily with good lung clearance. Still has some productive cough and hoarseness. 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 But she reports her quality of life is improved and getting more sleep. Yesterday had CT chest imaging completed.  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, medical exposures:  Programmer, systems   Past Medical History:  Diagnosis Date   Chronic kidney disease    hx  kidney stone   Diabetes mellitus without complication (HCC)    History of kidney stones 10 to 15 yrs ago   Hypertension    PONV (postoperative nausea and vomiting)    nausea only     Family History  Problem Relation Age of Onset   Asthma Mother     Social History   Occupational History   Not on file  Tobacco Use   Smoking status:  Never   Smokeless tobacco: Never  Substance and Sexual Activity   Alcohol use: No   Drug use: No   Sexual activity: Not on file    Allergies  Allergen Reactions   Codeine Other (See Comments)    "made my head fuzzy"   Penicillins Other (See Comments)    Whelps as child     Outpatient Medications Prior to Visit  Medication Sig Dispense Refill   albuterol (VENTOLIN HFA) 108 (90 Base) MCG/ACT inhaler Inhale 2 puffs into the lungs every 6 (six) hours as needed for wheezing or shortness of breath. 8 g 6   alendronate (FOSAMAX) 70 MG tablet Take 70 mg by mouth once a week. Take with a full glass of water on an empty stomach.     Biotin 5 MG CAPS Take 1 capsule by mouth daily.     Cholecalciferol (VITAMIN D3) 50 MCG (2000 UT) TABS Take 1 tablet by mouth daily.     losartan (COZAAR) 50 MG tablet Take 50 mg by mouth every evening.     MAGNESIUM PO Take by mouth.     metFORMIN (GLUCOPHAGE) 500 MG tablet Take 500 mg by mouth every evening.     Multiple Vitamins-Minerals (ZINC PO) Take by mouth.     Pyridoxine HCl (VITAMIN B6 PO) Take by mouth.     simvastatin (ZOCOR) 10 MG tablet Take 1 tablet by mouth every evening.     vitamin  E 400 UNIT capsule Take 400 Units by mouth daily.     brompheniramine-pseudoephedrine-DM 30-2-10 MG/5ML syrup Take 5 mLs by mouth 4 (four) times daily as needed. 120 mL 0   No facility-administered medications prior to visit.    Review of Systems  Constitutional:  Negative for chills, diaphoresis, fever, malaise/fatigue and weight loss.  HENT:  Negative for congestion.   Respiratory:  Positive for cough and sputum production. Negative for hemoptysis, shortness of breath and wheezing.   Cardiovascular:  Negative for chest pain, palpitations and leg swelling.    Objective:   Vitals:   07/24/21 1033  BP: 122/70  Pulse: 70  Temp: 98.2 F (36.8 C)  TempSrc: Oral  SpO2: 97%  Weight: 150 lb (68 kg)  Height: 5\' 3"  (1.6 m)  SpO2: 97 % O2 Device: None (Room  air)  Physical Exam: General: Well-appearing, no acute distress HENT: [place], AT Eyes: EOMI, no scleral icterus Respiratory: Clear to auscultation bilaterally.  No crackles, wheezing or rales Cardiovascular: RRR, -M/R/G, no JVD Extremities:-Edema,-tenderness Neuro: AAO x4, CNII-XII grossly intact Psych: Normal mood, normal affect  Data Reviewed:  Imaging: CT Chest 07/23/21 - Groundglass opacity noted in RML with atelectasis/scarring and 4 mm LUL that is likely benign  PFT: None on file  Labs: CBC    Component Value Date/Time   WBC 11.9 (H) 06/25/2015 1700   RBC 4.61 06/25/2015 1700   HGB 13.8 06/25/2015 1700   HCT 43.3 06/25/2015 1700   PLT 241 06/25/2015 1700   MCV 93.9 06/25/2015 1700   MCH 29.9 06/25/2015 1700   MCHC 31.9 06/25/2015 1700   RDW 13.5 06/25/2015 1700   LYMPHSABS 1.2 06/25/2015 1700   MONOABS 0.5 06/25/2015 1700   EOSABS 0.0 06/25/2015 1700   BASOSABS 0.0 06/25/2015 1700   Absolute eos 06/25/15 - 0    Assessment & Plan:   Discussion: 75 year old female never smoker who presents with chronic cough ~1 year associated with wheezing. Improved symptoms by 50% with supportive treatment. Wishes to cancel PFTs.  We reviewed CT scan with no evidence of mediastinal adenopathy. Groundglass opacity noted in RML with atelectasis/scarring and 4 mm LUL that is likely benign. Likely related to mucoid production. Discussed utility in repeat scan vs monitoring based on symptoms; will plan to follow-up with CT for resolution in 6 months  Chronic cough Shortness of breath Wheezing --CONTINUE Albuterol TWO puffs AS NEEDED for shortness of breath or wheezing --CONTINUE mucinex twice a day for 2-3 days for chest congestion AS NEEDED --ORDERED PRN cough syrup with refills x 2 --CANCEL pulmonary function tests due to improvement and appointment  Abnormal CT Ground glass opacities --ORDER CT chest without contrast in 6 months (May 2023)     Health  Maintenance Immunization History  Administered Date(s) Administered   Hepatitis B, adult 09/21/1998   Influenza Split 06/26/2012, 08/03/2017, 07/17/2019, 08/12/2020, 07/07/2021   Influenza, High Dose Seasonal PF 07/09/2015, 07/14/2016, 08/26/2018   Influenza-Unspecified 06/28/2014   Moderna Sars-Covid-2 Vaccination 10/10/2019, 11/08/2019, 08/12/2020   Pneumococcal Conjugate-13 01/05/2014   Pneumococcal Polysaccharide-23 12/21/2012   Td 12/21/2012   Tdap 03/30/2014   Zoster Recombinat (Shingrix) 03/21/2019   Zoster, Live 06/02/2013, 03/21/2019, 06/28/2019   CT Lung Screen- not indicated. Never smoker  No orders of the defined types were placed in this encounter.  Meds ordered this encounter  Medications   brompheniramine-pseudoephedrine-DM 30-2-10 MG/5ML syrup    Sig: Take 5 mLs by mouth 4 (four) times daily as needed.    Dispense:  473 mL    Refill:  2    Return in about 6 months (around 01/21/2022).  I have spent a total time of 32-minutes on the day of the appointment reviewing prior documentation, coordinating care and discussing medical diagnosis and plan with the patient/family. Past medical history, allergies, medications were reviewed. Pertinent imaging, labs and tests included in this note have been reviewed and interpreted independently by me.  Wendy Ham Mechele Collin, MD [facility] 07/24/2021 11:16 AM  Office Number [phone]

## 2021-07-24 NOTE — Patient Instructions (Addendum)
Chronic cough Shortness of breath Wheezing --CONTINUE Albuterol TWO puffs AS NEEDED for shortness of breath or wheezing --CONTINUE mucinex twice a day for 2-3 days for chest congestion AS NEEDED --ORDERED PRN cough syrup with refills x 2 --CANCEL pulmonary function tests due to improvement and appointment  Abnormal CT Ground glass opacities --ORDER CT chest without contrast in 6 months (May 2023)  Follow-up with me after CT scan (May 2023)

## 2021-08-20 ENCOUNTER — Ambulatory Visit: Payer: Medicare PPO | Admitting: Pulmonary Disease

## 2021-09-02 DIAGNOSIS — E119 Type 2 diabetes mellitus without complications: Secondary | ICD-10-CM | POA: Diagnosis not present

## 2021-09-02 DIAGNOSIS — H5203 Hypermetropia, bilateral: Secondary | ICD-10-CM | POA: Diagnosis not present

## 2021-12-29 ENCOUNTER — Other Ambulatory Visit: Payer: Self-pay | Admitting: Internal Medicine

## 2021-12-29 DIAGNOSIS — R911 Solitary pulmonary nodule: Secondary | ICD-10-CM | POA: Diagnosis not present

## 2021-12-29 DIAGNOSIS — N182 Chronic kidney disease, stage 2 (mild): Secondary | ICD-10-CM | POA: Diagnosis not present

## 2021-12-29 DIAGNOSIS — Z1239 Encounter for other screening for malignant neoplasm of breast: Secondary | ICD-10-CM | POA: Diagnosis not present

## 2021-12-29 DIAGNOSIS — I10 Essential (primary) hypertension: Secondary | ICD-10-CM | POA: Diagnosis not present

## 2021-12-29 DIAGNOSIS — E049 Nontoxic goiter, unspecified: Secondary | ICD-10-CM | POA: Diagnosis not present

## 2021-12-29 DIAGNOSIS — E7800 Pure hypercholesterolemia, unspecified: Secondary | ICD-10-CM | POA: Diagnosis not present

## 2021-12-29 DIAGNOSIS — Z1231 Encounter for screening mammogram for malignant neoplasm of breast: Secondary | ICD-10-CM

## 2021-12-29 DIAGNOSIS — M810 Age-related osteoporosis without current pathological fracture: Secondary | ICD-10-CM

## 2021-12-29 DIAGNOSIS — E1122 Type 2 diabetes mellitus with diabetic chronic kidney disease: Secondary | ICD-10-CM | POA: Diagnosis not present

## 2021-12-30 ENCOUNTER — Other Ambulatory Visit: Payer: Self-pay | Admitting: Internal Medicine

## 2021-12-30 DIAGNOSIS — E049 Nontoxic goiter, unspecified: Secondary | ICD-10-CM

## 2022-01-01 ENCOUNTER — Ambulatory Visit
Admission: RE | Admit: 2022-01-01 | Discharge: 2022-01-01 | Disposition: A | Discharge status: Home / Self Care | Payer: Medicare PPO | Source: Ambulatory Visit | Attending: Internal Medicine | Admitting: Internal Medicine

## 2022-01-01 DIAGNOSIS — E049 Nontoxic goiter, unspecified: Secondary | ICD-10-CM | POA: Diagnosis not present

## 2022-01-05 ENCOUNTER — Other Ambulatory Visit: Payer: Self-pay | Admitting: Internal Medicine

## 2022-01-05 DIAGNOSIS — E041 Nontoxic single thyroid nodule: Secondary | ICD-10-CM

## 2022-01-15 ENCOUNTER — Ambulatory Visit
Admission: RE | Admit: 2022-01-15 | Discharge: 2022-01-15 | Disposition: A | Discharge status: Home / Self Care | Payer: Medicare PPO | Source: Ambulatory Visit | Attending: Internal Medicine | Admitting: Internal Medicine

## 2022-01-15 ENCOUNTER — Other Ambulatory Visit (HOSPITAL_COMMUNITY)
Admission: RE | Admit: 2022-01-15 | Discharge: 2022-01-15 | Disposition: A | Discharge status: Home / Self Care | Payer: Medicare PPO | Source: Ambulatory Visit | Attending: Internal Medicine | Admitting: Internal Medicine

## 2022-01-15 DIAGNOSIS — E041 Nontoxic single thyroid nodule: Secondary | ICD-10-CM | POA: Diagnosis not present

## 2022-01-19 LAB — CYTOLOGY - NON PAP

## 2022-01-27 ENCOUNTER — Ambulatory Visit (INDEPENDENT_AMBULATORY_CARE_PROVIDER_SITE_OTHER)
Admission: RE | Admit: 2022-01-27 | Discharge: 2022-01-27 | Disposition: A | Discharge status: Home / Self Care | Payer: Medicare PPO | Source: Ambulatory Visit | Attending: Pulmonary Disease | Admitting: Pulmonary Disease

## 2022-01-27 DIAGNOSIS — R053 Chronic cough: Secondary | ICD-10-CM | POA: Diagnosis not present

## 2022-01-27 DIAGNOSIS — I700 Atherosclerosis of aorta: Secondary | ICD-10-CM | POA: Diagnosis not present

## 2022-02-03 ENCOUNTER — Ambulatory Visit: Payer: Medicare PPO | Admitting: Pulmonary Disease

## 2022-02-03 ENCOUNTER — Encounter: Payer: Self-pay | Admitting: Pulmonary Disease

## 2022-02-03 VITALS — BP 118/80 | HR 79 | Ht 63.0 in | Wt 153.0 lb

## 2022-02-03 DIAGNOSIS — J42 Unspecified chronic bronchitis: Secondary | ICD-10-CM | POA: Diagnosis not present

## 2022-02-03 NOTE — Progress Notes (Signed)
? ? ?Subjective:  ? ?PATIENT ID: Wendy Fields GENDER: female DOB: March 14, 1946, MRN: [id number] ? ? ?HPI ? ?Chief Complaint  ?Patient presents with  ? Follow-up  ?  Cough is better, not gone ?CT completed 01-27-22  ? ? ?Reason for Visit: Follow-up ? ?[name] is a 76 year old female never smoker with CKD stage II, DM2, HLD, HTN, osteoporosis who presents for follow-up for cough ? ?Initial consult ?She was seen by her PCP 05/20/21. Note reviewed by Dr. Lysle Fields. Chronic cough treated with z pack x 2 courses which improves it. Trial of PPI x 2 months without improvement.  ?She reports cough and wheezing since November 2021. Family history of asthma (mother and granddaughter). She does not have a formal diagnosis. She has not tried any inhalers but did receive one when she went to urgent care. She reports her cough is productive with thick sputum. Occasional wheezing noticed at night. Coughing occurs throughout the day. Her cough will improve after sputum clearance. Denies allergic rhinitis. Denies heartburn symptoms. Has tried mucinex DM intermittently for symptoms. ? ?07/24/21 ?Since our last visit, cough is better controlled on cough syrup. Uses albuterol twice a day Improved by 50%. Took mucinex daily with good lung clearance. Still has some productive cough and hoarseness. 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 But she reports her quality of life is improved and getting more sleep. Yesterday had CT chest imaging completed. ? ?02/03/22 ?Overall the cough is improved since last visit and well controlled with cough syrup once a week. No longer needing albuterol inhaler. Worsens with pollen. Has nasal congestion. Hoarseness occurs with coughing.  ? 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 ? ?Past Medical History:  ?Diagnosis Date  ? Chronic kidney disease   ? hx  kidney stone  ? Diabetes mellitus without complication ([place])   ? History of kidney stones 10 to 15 yrs ago  ?  Hypertension   ? PONV (postoperative nausea and vomiting)   ? nausea only  ?  ? ?Family History  ?Problem Relation Age of Onset  ? Asthma Mother   ? ? ?Social History  ? ?Occupational History  ? Not on file  ?Tobacco Use  ? Smoking status: Never  ? Smokeless tobacco: Never  ?Substance and Sexual Activity  ? Alcohol use: No  ? Drug use: No  ? Sexual activity: Not on file  ? ? ?Allergies  ?Allergen Reactions  ? Codeine Other (See Comments)  ?  "made my head fuzzy"  ? Penicillins Other (See Comments)  ?  Whelps as child  ?  ? ?Outpatient Medications Prior to Visit  ?Medication Sig Dispense Refill  ? albuterol (VENTOLIN HFA) 108 (90 Base) MCG/ACT inhaler Inhale 2 puffs into the lungs every 6 (six) hours as needed for wheezing or shortness of breath. 8 g 6  ? alendronate (FOSAMAX) 70 MG tablet Take 70 mg by mouth once a week. Take with a full glass of water on an empty stomach.    ? Biotin 5 MG CAPS Take 1 capsule by mouth daily.    ? brompheniramine-pseudoephedrine-DM 30-2-10 MG/5ML syrup Take 5 mLs by mouth 4 (four) times daily as needed. 473 mL 2  ? Cholecalciferol (VITAMIN D3) 50 MCG (2000 UT) TABS Take 1 tablet by mouth daily.    ? losartan (COZAAR) 50 MG tablet Take 50 mg by mouth every evening.    ? MAGNESIUM PO Take by mouth.    ?  metFORMIN (GLUCOPHAGE) 500 MG tablet Take 500 mg by mouth 2 (two) times daily.    ? Multiple Vitamins-Minerals (ZINC PO) Take by mouth.    ? Pyridoxine HCl (VITAMIN B6 PO) Take by mouth.    ? simvastatin (ZOCOR) 10 MG tablet Take 1 tablet by mouth every evening.    ? vitamin E 400 UNIT capsule Take 400 Units by mouth daily.    ? ?No facility-administered medications prior to visit.  ? ? ?Review of Systems  ?Constitutional:  Negative for chills, diaphoresis, fever, malaise/fatigue and weight loss.  ?HENT:  Negative for congestion.   ?Respiratory:  Positive for cough. Negative for hemoptysis, sputum production, shortness of breath and wheezing.   ?Cardiovascular:  Negative for chest pain,  palpitations and leg swelling.  ? ? ?Objective:  ? ?Vitals:  ? 02/03/22 1014  ?BP: 118/80  ?Pulse: 79  ?SpO2: 97%  ?Weight: 153 lb (69.4 kg)  ?Height: 5\' 3"  (1.6 m)  ?SpO2: 97 % ?O2 Device: None (Room air) ? ?Physical Exam: ?General: Well-appearing, no acute distress ?HENT: [place], AT ?Eyes: EOMI, no scleral icterus ?Respiratory: Clear to auscultation bilaterally.  No crackles, wheezing or rales ?Cardiovascular: RRR, -M/R/G, no JVD ?Extremities:-Edema,-tenderness ?Neuro: AAO x4, CNII-XII grossly intact ?Psych: Normal mood, normal affect ? ?Data Reviewed: ? ?Imaging: ?CT Chest 07/23/21 - Groundglass opacity noted in RML with atelectasis/scarring and 4 mm LUL that is likely benign ?CT Chest 01/21/22 - resolution the ground glass opacities and lung base ? ?PFT: ?None on file ? ?Labs: ?CBC ?   ?Component Value Date/Time  ? WBC 11.9 (H) 06/25/2015 1700  ? RBC 4.61 06/25/2015 1700  ? HGB 13.8 06/25/2015 1700  ? HCT 43.3 06/25/2015 1700  ? PLT 241 06/25/2015 1700  ? MCV 93.9 06/25/2015 1700  ? MCH 29.9 06/25/2015 1700  ? MCHC 31.9 06/25/2015 1700  ? RDW 13.5 06/25/2015 1700  ? LYMPHSABS 1.2 06/25/2015 1700  ? MONOABS 0.5 06/25/2015 1700  ? EOSABS 0.0 06/25/2015 1700  ? BASOSABS 0.0 06/25/2015 1700  ? ?Absolute eos 06/25/15 - 0 ?   ?Assessment & Plan:  ? ?Discussion: ?76 year old female never smoker who presents with chronic cough >1 year associated with wheezing.  Reviewed CT scan which demonstrated resolution of prior groundglass opacities in the right middle. Wheezing has resolved and coughing as has improved ? ?Chronic cough - improving ?Wheezing - resolved ?--STOP Albuterol ?--PRN cough syrup ?--Flonase as needed for nasal congestion ? ?Abnormal CT ?Ground glass opacities - resolved ?--No further scans recommended ? ?Health Maintenance ?Immunization History  ?Administered Date(s) Administered  ? Hepatitis B, adult 09/21/1998  ? Influenza Split 06/26/2012, 08/03/2017, 07/17/2019, 08/12/2020, 07/07/2021  ? Influenza, High Dose  Seasonal PF 07/09/2015, 07/14/2016, 08/26/2018  ? Influenza-Unspecified 06/28/2014  ? Moderna Sars-Covid-2 Vaccination 10/10/2019, 11/08/2019, 08/12/2020  ? Pneumococcal Conjugate-13 01/05/2014  ? Pneumococcal Polysaccharide-23 12/21/2012  ? Td 12/21/2012  ? Tdap 03/30/2014  ? Zoster Recombinat (Shingrix) 03/21/2019  ? Zoster, Live 06/02/2013, 03/21/2019, 06/28/2019  ? ?CT Lung Screen- not indicated. Never smoker ? ?No orders of the defined types were placed in this encounter. ? ?No orders of the defined types were placed in this encounter. ? ? ?Return if symptoms worsen or fail to improve. ? ?I have spent a total time of 20-minutes on the day of the appointment reviewing prior documentation, coordinating care and discussing medical diagnosis and plan with the patient/family. Past medical history, allergies, medications were reviewed. Pertinent imaging, labs and tests included in this note have been reviewed and  interpreted independently by me. ? ?Wendy Kocher Rodman Pickle, MD ?[facility] ?02/03/2022 10:34 AM  ?Office Number [phone] ? ? ? ?

## 2022-03-08 ENCOUNTER — Other Ambulatory Visit: Payer: Self-pay

## 2022-03-08 ENCOUNTER — Emergency Department (HOSPITAL_COMMUNITY): Payer: Medicare PPO

## 2022-03-08 ENCOUNTER — Encounter (HOSPITAL_COMMUNITY): Payer: Self-pay

## 2022-03-08 ENCOUNTER — Emergency Department (HOSPITAL_COMMUNITY)
Admission: EM | Admit: 2022-03-08 | Discharge: 2022-03-08 | Disposition: A | Discharge status: Home / Self Care | Payer: Medicare PPO | Attending: Emergency Medicine | Admitting: Emergency Medicine

## 2022-03-08 ENCOUNTER — Emergency Department (HOSPITAL_COMMUNITY)
Admission: EM | Admit: 2022-03-08 | Discharge: 2022-03-08 | Disposition: A | Discharge status: Home / Self Care | Payer: Medicare PPO | Source: Home / Self Care | Attending: Emergency Medicine | Admitting: Emergency Medicine

## 2022-03-08 DIAGNOSIS — E1122 Type 2 diabetes mellitus with diabetic chronic kidney disease: Secondary | ICD-10-CM | POA: Diagnosis not present

## 2022-03-08 DIAGNOSIS — J45909 Unspecified asthma, uncomplicated: Secondary | ICD-10-CM | POA: Diagnosis not present

## 2022-03-08 DIAGNOSIS — R000 Tachycardia, unspecified: Secondary | ICD-10-CM | POA: Diagnosis not present

## 2022-03-08 DIAGNOSIS — J209 Acute bronchitis, unspecified: Secondary | ICD-10-CM | POA: Diagnosis not present

## 2022-03-08 DIAGNOSIS — N189 Chronic kidney disease, unspecified: Secondary | ICD-10-CM | POA: Diagnosis not present

## 2022-03-08 DIAGNOSIS — R052 Subacute cough: Secondary | ICD-10-CM | POA: Insufficient documentation

## 2022-03-08 DIAGNOSIS — R0602 Shortness of breath: Secondary | ICD-10-CM | POA: Diagnosis not present

## 2022-03-08 DIAGNOSIS — I129 Hypertensive chronic kidney disease with stage 1 through stage 4 chronic kidney disease, or unspecified chronic kidney disease: Secondary | ICD-10-CM | POA: Insufficient documentation

## 2022-03-08 DIAGNOSIS — R059 Cough, unspecified: Secondary | ICD-10-CM | POA: Diagnosis not present

## 2022-03-08 LAB — CBC
HCT: 44.4 % (ref 36.0–46.0)
Hemoglobin: 14 g/dL (ref 12.0–15.0)
MCH: 29.9 pg (ref 26.0–34.0)
MCHC: 31.5 g/dL (ref 30.0–36.0)
MCV: 94.9 fL (ref 80.0–100.0)
Platelets: 290 10*3/uL (ref 150–400)
RBC: 4.68 MIL/uL (ref 3.87–5.11)
RDW: 13.9 % (ref 11.5–15.5)
WBC: 11.5 10*3/uL — ABNORMAL HIGH (ref 4.0–10.5)
nRBC: 0 % (ref 0.0–0.2)

## 2022-03-08 LAB — BASIC METABOLIC PANEL
Anion gap: 11 (ref 5–15)
BUN: 15 mg/dL (ref 8–23)
CO2: 23 mmol/L (ref 22–32)
Calcium: 9.3 mg/dL (ref 8.9–10.3)
Chloride: 106 mmol/L (ref 98–111)
Creatinine, Ser: 0.96 mg/dL (ref 0.44–1.00)
GFR, Estimated: >60 mL/min (ref >60)
Glucose, Bld: 201 mg/dL — ABNORMAL HIGH (ref 70–99)
Potassium: 3.5 mmol/L (ref 3.5–5.1)
Sodium: 140 mmol/L (ref 135–145)

## 2022-03-08 MED ORDER — PREDNISONE 20 MG PO TABS
60.0000 mg | ORAL_TABLET | Freq: Once | ORAL | Status: AC
  Administered 2022-03-08: 60 mg via ORAL
  Filled 2022-03-08: qty 3

## 2022-03-08 MED ORDER — IPRATROPIUM-ALBUTEROL 0.5-2.5 (3) MG/3ML IN SOLN
3.0000 mL | Freq: Once | RESPIRATORY_TRACT | Status: AC
  Administered 2022-03-08: 3 mL via RESPIRATORY_TRACT
  Filled 2022-03-08: qty 3

## 2022-03-08 MED ORDER — PREDNISONE 20 MG PO TABS: 20.0000 mg | ORAL_TABLET | Freq: Two times a day (BID) | ORAL | 0 refills | Status: AC

## 2022-03-08 MED ORDER — ALBUTEROL SULFATE HFA 108 (90 BASE) MCG/ACT IN AERS
2.0000 | INHALATION_SPRAY | Freq: Once | RESPIRATORY_TRACT | Status: AC
  Administered 2022-03-08: 2 via RESPIRATORY_TRACT
  Filled 2022-03-08: qty 6.7

## 2022-03-08 NOTE — ED Provider Notes (Signed)
[facility]-EMERGENCY DEPT Provider Note   CSN: [id number] Arrival date & time: 03/08/22  0736     History {Add pertinent medical, surgical, social history, OB history to HPI:1} Chief Complaint  Patient presents with   Cough    Wendy Fields is a 76 y.o. female.  HPI She was seen earlier this morning for evaluation of cough.  As she was leaving the emergency department, walking the parking lot she had a coughing spell that she could not get to resolve.  She tried using her inhaler but there was no medicine left in it.  She decided to check back in for reevaluation.  Cough is nonproductive.  She denies fever.  She does not have any chest pain.  The cough has improved since the episode earlier while walking in the parking lot.    Home Medications Prior to Admission medications   Medication Sig Start Date End Date Taking? Authorizing Provider  albuterol (VENTOLIN HFA) 108 (90 Base) MCG/ACT inhaler Inhale 2 puffs into the lungs every 6 (six) hours as needed for wheezing or shortness of breath. 07/04/21   Luciano Cutter, MD  alendronate (FOSAMAX) 70 MG tablet Take 70 mg by mouth once a week. Take with a full glass of water on an empty stomach.    [provider]  Biotin 5 MG CAPS Take 1 capsule by mouth daily.    [provider]  brompheniramine-pseudoephedrine-DM 30-2-10 MG/5ML syrup Take 5 mLs by mouth 4 (four) times daily as needed. 07/24/21   Luciano Cutter, MD  Cholecalciferol (VITAMIN D3) 50 MCG (2000 UT) TABS Take 1 tablet by mouth daily.    [provider]  losartan (COZAAR) 50 MG tablet Take 50 mg by mouth every evening.    [provider]  MAGNESIUM PO Take by mouth.    [provider]  metFORMIN (GLUCOPHAGE) 500 MG tablet Take 500 mg by mouth 2 (two) times daily.    [provider]  Multiple Vitamins-Minerals (ZINC PO) Take by mouth.    [provider]  Pyridoxine HCl (VITAMIN B6 PO)  Take by mouth.    [provider]  simvastatin (ZOCOR) 10 MG tablet Take 1 tablet by mouth every evening.    [provider]  vitamin E 400 UNIT capsule Take 400 Units by mouth daily.    [provider]      Allergies    Codeine and Penicillins    Review of Systems   Review of Systems  Physical Exam Updated Vital Signs BP (!) 143/79 (BP Location: Left Arm)   Pulse 94   Temp 98.4 F (36.9 C) (Oral)   Resp 16   Ht 5\' 3"  (1.6 m)   Wt 69.4 kg   SpO2 96%   BMI 27.10 kg/m  Physical Exam Vitals and nursing note reviewed.  Constitutional:      Appearance: She is well-developed. She is not ill-appearing.  HENT:     Head: Normocephalic and atraumatic.     Right Ear: External ear normal.     Left Ear: External ear normal.     Nose: No congestion or rhinorrhea.     Mouth/Throat:     Pharynx: No oropharyngeal exudate or posterior oropharyngeal erythema.  Eyes:     Conjunctiva/sclera: Conjunctivae normal.     Pupils: Pupils are equal, round, and reactive to light.  Neck:     Trachea: Phonation normal.  Cardiovascular:     Rate and Rhythm: Normal rate  and regular rhythm.     Heart sounds: Normal heart sounds.  Pulmonary:     Effort: Pulmonary effort is normal. No respiratory distress.     Breath sounds: No stridor.     Comments: Decreased air movement bilaterally without audible wheezes rales or rhonchi. Abdominal:     Palpations: Abdomen is soft.     Tenderness: There is no abdominal tenderness.  Musculoskeletal:        General: Normal range of motion.     Cervical back: Normal range of motion and neck supple.  Skin:    General: Skin is warm and dry.  Neurological:     Mental Status: She is alert and oriented to person, place, and time.     Cranial Nerves: No cranial nerve deficit.     Sensory: No sensory deficit.     Motor: No abnormal muscle tone.     Coordination: Coordination normal.  Psychiatric:        Mood and Affect: Mood normal.         Behavior: Behavior normal.        Thought Content: Thought content normal.        Judgment: Judgment normal.     ED Results / Procedures / Treatments   Labs (all labs ordered are listed, but only abnormal results are displayed) Labs Reviewed - No data to display  EKG None  Radiology DG Chest [facility] 1 View  Result Date: 03/08/2022 CLINICAL DATA:  76 year old female with shortness of breath overnight. No improvement with inhaler. EXAM: PORTABLE CHEST 1 VIEW COMPARISON:  Chest CT 01/27/2022 and earlier. FINDINGS: Portable AP semi upright view at 0512 hours. Mildly lower lung volumes. Mediastinal contours are stable since last year and within normal limits. Allowing for portable technique the lungs are clear. No pneumothorax or pleural effusion. Scoliosis. No acute osseous abnormality identified. Negative visible bowel gas. IMPRESSION: No acute cardiopulmonary abnormality. Electronically Signed   By: Odessa Fleming M.D.   On: 03/08/2022 06:00    Procedures Procedures  {Document cardiac monitor, telemetry assessment procedure when appropriate:1}  Medications Ordered in ED Medications  predniSONE (DELTASONE) tablet 60 mg (has no administration in time range)    ED Course/ Medical Decision Making/ A&P                           Medical Decision Making Persistent coughing, with history of bronchitis.  Seen earlier in the ED and had evaluation with negative chest x-ray.  Will start prednisone and reevaluate  Risk Prescription drug management.   ***  {Document critical care time when appropriate:1} {Document review of labs and clinical decision tools ie heart score, Chads2Vasc2 etc:1}  {Document your independent review of radiology images, and any outside records:1} {Document your discussion with family members, caretakers, and with consultants:1} {Document social determinants of health affecting pt's care:1} {Document your decision making why or why not admission, treatments were  needed:1} Final Clinical Impression(s) / ED Diagnoses Final diagnoses:  None    Rx / DC Orders ED Discharge Orders     None

## 2022-03-08 NOTE — ED Notes (Signed)
Patient reports improvement in symptoms.  States "my breathing is better."  Updated on plan of care.  No c/o voiced at this time

## 2022-03-08 NOTE — ED Triage Notes (Signed)
Patient was discharged from the ED this AM.  Patient states she went outside and "had another coughing spell and couldn't catch her breath." Patient states she tried to use the Albuterol inhaler but couldn't get enough air to use it.

## 2022-03-08 NOTE — Discharge Instructions (Signed)
Start the prednisone prescription this afternoon.  Use the inhaler 2 to 4 puffs every 3 hours as needed for cough or trouble breathing.  Also use your cough medicine, which you have at home currently.  Drink plenty of fluids to improve your hydration and help your cough.  Follow-up with your primary care doctor or pulmonologist as needed for problems.  Return here, if needed.

## 2022-03-08 NOTE — Discharge Instructions (Signed)
Call your PCP and Pulmonology doctor on Monday to discuss your ED visit and schedule a follow up.

## 2022-03-08 NOTE — ED Provider Notes (Signed)
Emergency Department Provider Note   I have reviewed the triage vital signs and the nursing notes.   HISTORY  Chief Complaint Asthma and Shortness of Breath   HPI Wendy Fields is a 76 y.o. female with PMH reviewed below including following with Pulmonology for chronic cough/wheezing presents to the ED with coughing episodes this evening. Episodes were more severe and keeping her from sleeping. She is compliant with medications. MDI seems to help but patient notes that sometimes cough is severe to the point where using the MDI is difficult. She has been taking cough syrup and Mucinex with some relief as well. No CP. No fever. No rash or throat tightness.    Past Medical History:  Diagnosis Date   Chronic kidney disease    hx  kidney stone   Diabetes mellitus without complication (HCC)    History of kidney stones 10 to 15 yrs ago   Hypertension    PONV (postoperative nausea and vomiting)    nausea only    Review of Systems  Constitutional: No fever/chills ENT: No sore throat. Cardiovascular: Denies chest pain. Respiratory: Positive shortness of breath and cough.  Gastrointestinal: No abdominal pain.  No nausea, no vomiting.  Musculoskeletal: Negative for back pain. Skin: Negative for rash.   ____________________________________________   PHYSICAL EXAM:  VITAL SIGNS: ED Triage Vitals  Enc Vitals Group     BP 03/08/22 0436 (!) 167/91     Pulse Rate 03/08/22 0436 (!) 116     Resp 03/08/22 0436 18     Temp 03/08/22 0436 98.1 F (36.7 C)     Temp Source 03/08/22 0436 Oral     SpO2 03/08/22 0436 96 %     Weight 03/08/22 0436 153 lb (69.4 kg)     Height 03/08/22 0436 5\' 3"  (1.6 m)   Constitutional: Alert and oriented. Patient with frequent cough but able to provide a full history.  Eyes: Conjunctivae are normal.  Head: Atraumatic. Nose: No congestion/rhinnorhea. Mouth/Throat: Mucous membranes are moist.  Oropharynx non-erythematous. Neck: No  stridor. Cardiovascular: Tachycardia. Good peripheral circulation. Grossly normal heart sounds.   Respiratory: Normal respiratory effort between coughing episodes.  No retractions. Lungs with mild end-expiratory wheezing at the bases bilaterally.  Gastrointestinal: Soft and nontender. No distention.  Musculoskeletal: No lower extremity tenderness nor edema. No gross deformities of extremities. Neurologic:  Normal speech and language. No gross focal neurologic deficits are appreciated.  Skin:  Skin is warm, dry and intact. No rash noted.  ____________________________________________   LABS (all labs ordered are listed, but only abnormal results are displayed)  Labs Reviewed  BASIC METABOLIC PANEL - Abnormal; Notable for the following components:      Result Value   Glucose, Bld 201 (*)    All other components within normal limits  CBC - Abnormal; Notable for the following components:   WBC 11.5 (*)    All other components within normal limits   ____________________________________________  EKG   EKG Interpretation  Date/Time:  Sunday March 08 2022 04:43:00 EDT Ventricular Rate:  92 PR Interval:  167 QRS Duration: 88 QT Interval:  353 QTC Calculation: 437 R Axis:   39 Text Interpretation: Sinus rhythm Borderline repolarization abnormality Confirmed by [id number] [phone]) on 03/08/2022 4:59:25 AM        ____________________________________________  RADIOLOGY  DG Chest Port 1 View  Result Date: 03/08/2022 CLINICAL DATA:  76 year old female with shortness of breath overnight. No improvement with inhaler. EXAM: PORTABLE CHEST 1 VIEW COMPARISON:  Chest CT 01/27/2022 and earlier. FINDINGS: Portable AP semi upright view at 0512 hours. Mildly lower lung volumes. Mediastinal contours are stable since last year and within normal limits. Allowing for portable technique the lungs are clear. No pneumothorax or pleural effusion. Scoliosis. No acute osseous abnormality identified.  Negative visible bowel gas. IMPRESSION: No acute cardiopulmonary abnormality. Electronically Signed   By: Odessa Fleming M.D.   On: 03/08/2022 06:00    ____________________________________________   PROCEDURES  Procedure(s) performed:   Procedures  None  ____________________________________________   INITIAL IMPRESSION / ASSESSMENT AND PLAN / ED COURSE  Pertinent labs & imaging results that were available during my care of the patient were reviewed by me and considered in my medical decision making (see chart for details).   This patient is Presenting for Evaluation of cough/SOB, which does require a range of treatment options, and is a complaint that involves a high risk of morbidity and mortality.  The Differential Diagnoses include asthma flare, chronic cough, anaphylaxis, CAP, PE, ACS.  Critical Interventions-    Medications  ipratropium-albuterol (DUONEB) 0.5-2.5 (3) MG/3ML nebulizer solution 3 mL (3 mLs Nebulization Given 03/08/22 0458)    Reassessment after intervention: Symptoms greatly improved.    I did obtain Additional Historical Information from husband at bedside, as the patient is coughing frequently.  I decided to review pertinent External Data, and in summary reviewed last Pulmonology note from 02/03/22.   Clinical Laboratory Tests Ordered, included CBC without anemia. No AKI or electrolyte disturbance.   Radiologic Tests Ordered, included CXR. I independently interpreted the images and agree with radiology interpretation.   Cardiac Monitor Tracing which shows sinus tachycardia.   Social Determinants of Health Risk no smoking history.   Medical Decision Making: Summary:  Patient presents emergency department with shortness of breath and cough.  Some mild wheezing appreciated at the bases bilaterally.  Does not appear to be an anaphylactic reaction.  Plan for DuoNeb, screening blood work, chest x-ray and reassess.  Reevaluation with update and discussion with  patient and husband. Symptoms improved. Plan for d/c with plan for Pulmonology follow up as an outpatient.   Disposition: discharge  ____________________________________________  FINAL CLINICAL IMPRESSION(S) / ED DIAGNOSES  Final diagnoses:  Subacute cough    Note:  This document was prepared using Dragon voice recognition software and may include unintentional dictation errors.  Alona Bene, MD, [facility] Emergency Medicine    Blythe Hartshorn, Arlyss Repress, MD 03/08/22 [phone]

## 2022-03-08 NOTE — ED Triage Notes (Signed)
Patient said she woke up about 4 times in the middle of the night and could not catch her breath. She recently was seen at urgent care and got an inhaler, used it last night but it did not provide relief. Not coughing anything up.

## 2022-05-26 DIAGNOSIS — N182 Chronic kidney disease, stage 2 (mild): Secondary | ICD-10-CM | POA: Diagnosis not present

## 2022-05-26 DIAGNOSIS — M810 Age-related osteoporosis without current pathological fracture: Secondary | ICD-10-CM | POA: Diagnosis not present

## 2022-05-26 DIAGNOSIS — I700 Atherosclerosis of aorta: Secondary | ICD-10-CM | POA: Diagnosis not present

## 2022-05-26 DIAGNOSIS — E041 Nontoxic single thyroid nodule: Secondary | ICD-10-CM | POA: Diagnosis not present

## 2022-05-26 DIAGNOSIS — E1122 Type 2 diabetes mellitus with diabetic chronic kidney disease: Secondary | ICD-10-CM | POA: Diagnosis not present

## 2022-05-26 DIAGNOSIS — Z0000 Encounter for general adult medical examination without abnormal findings: Secondary | ICD-10-CM | POA: Diagnosis not present

## 2022-05-26 DIAGNOSIS — I10 Essential (primary) hypertension: Secondary | ICD-10-CM | POA: Diagnosis not present

## 2022-05-26 DIAGNOSIS — Z1331 Encounter for screening for depression: Secondary | ICD-10-CM | POA: Diagnosis not present

## 2022-05-26 DIAGNOSIS — E785 Hyperlipidemia, unspecified: Secondary | ICD-10-CM | POA: Diagnosis not present

## 2022-06-23 ENCOUNTER — Ambulatory Visit
Admission: RE | Admit: 2022-06-23 | Discharge: 2022-06-23 | Disposition: A | Discharge status: Home / Self Care | Payer: Medicare PPO | Source: Ambulatory Visit | Attending: Internal Medicine | Admitting: Internal Medicine

## 2022-06-23 DIAGNOSIS — Z1231 Encounter for screening mammogram for malignant neoplasm of breast: Secondary | ICD-10-CM

## 2022-06-23 DIAGNOSIS — M85852 Other specified disorders of bone density and structure, left thigh: Secondary | ICD-10-CM | POA: Diagnosis not present

## 2022-06-23 DIAGNOSIS — M810 Age-related osteoporosis without current pathological fracture: Secondary | ICD-10-CM | POA: Diagnosis not present

## 2022-06-23 DIAGNOSIS — Z780 Asymptomatic menopausal state: Secondary | ICD-10-CM | POA: Diagnosis not present

## 2022-07-06 DIAGNOSIS — M810 Age-related osteoporosis without current pathological fracture: Secondary | ICD-10-CM | POA: Diagnosis not present

## 2022-07-06 DIAGNOSIS — Z23 Encounter for immunization: Secondary | ICD-10-CM | POA: Diagnosis not present

## 2022-09-08 DIAGNOSIS — E119 Type 2 diabetes mellitus without complications: Secondary | ICD-10-CM | POA: Diagnosis not present

## 2022-09-08 DIAGNOSIS — H43813 Vitreous degeneration, bilateral: Secondary | ICD-10-CM | POA: Diagnosis not present

## 2022-09-08 DIAGNOSIS — H2513 Age-related nuclear cataract, bilateral: Secondary | ICD-10-CM | POA: Diagnosis not present

## 2022-09-08 DIAGNOSIS — H524 Presbyopia: Secondary | ICD-10-CM | POA: Diagnosis not present

## 2022-10-02 DIAGNOSIS — M810 Age-related osteoporosis without current pathological fracture: Secondary | ICD-10-CM | POA: Diagnosis not present

## 2022-10-14 DIAGNOSIS — R059 Cough, unspecified: Secondary | ICD-10-CM | POA: Diagnosis not present

## 2022-10-14 DIAGNOSIS — Z20822 Contact with and (suspected) exposure to covid-19: Secondary | ICD-10-CM | POA: Diagnosis not present

## 2022-10-15 DIAGNOSIS — Z20822 Contact with and (suspected) exposure to covid-19: Secondary | ICD-10-CM | POA: Diagnosis not present

## 2022-10-15 DIAGNOSIS — R059 Cough, unspecified: Secondary | ICD-10-CM | POA: Diagnosis not present

## 2022-10-16 DIAGNOSIS — R0602 Shortness of breath: Secondary | ICD-10-CM | POA: Diagnosis not present

## 2022-10-16 DIAGNOSIS — R062 Wheezing: Secondary | ICD-10-CM | POA: Diagnosis not present

## 2022-10-16 DIAGNOSIS — R059 Cough, unspecified: Secondary | ICD-10-CM | POA: Diagnosis not present

## 2022-10-16 DIAGNOSIS — J209 Acute bronchitis, unspecified: Secondary | ICD-10-CM | POA: Diagnosis not present

## 2022-11-24 DIAGNOSIS — R059 Cough, unspecified: Secondary | ICD-10-CM | POA: Diagnosis not present

## 2022-11-24 DIAGNOSIS — Z20822 Contact with and (suspected) exposure to covid-19: Secondary | ICD-10-CM | POA: Diagnosis not present

## 2022-11-26 DIAGNOSIS — R059 Cough, unspecified: Secondary | ICD-10-CM | POA: Diagnosis not present

## 2022-11-26 DIAGNOSIS — Z20822 Contact with and (suspected) exposure to covid-19: Secondary | ICD-10-CM | POA: Diagnosis not present

## 2022-11-28 DIAGNOSIS — Z20822 Contact with and (suspected) exposure to covid-19: Secondary | ICD-10-CM | POA: Diagnosis not present

## 2022-11-28 DIAGNOSIS — R059 Cough, unspecified: Secondary | ICD-10-CM | POA: Diagnosis not present

## 2022-11-29 DIAGNOSIS — Z20822 Contact with and (suspected) exposure to covid-19: Secondary | ICD-10-CM | POA: Diagnosis not present

## 2022-11-29 DIAGNOSIS — R059 Cough, unspecified: Secondary | ICD-10-CM | POA: Diagnosis not present

## 2022-12-02 DIAGNOSIS — R059 Cough, unspecified: Secondary | ICD-10-CM | POA: Diagnosis not present

## 2022-12-02 DIAGNOSIS — Z20822 Contact with and (suspected) exposure to covid-19: Secondary | ICD-10-CM | POA: Diagnosis not present

## 2022-12-08 DIAGNOSIS — Z20822 Contact with and (suspected) exposure to covid-19: Secondary | ICD-10-CM | POA: Diagnosis not present

## 2022-12-08 DIAGNOSIS — R059 Cough, unspecified: Secondary | ICD-10-CM | POA: Diagnosis not present

## 2022-12-10 DIAGNOSIS — R059 Cough, unspecified: Secondary | ICD-10-CM | POA: Diagnosis not present

## 2022-12-10 DIAGNOSIS — Z20822 Contact with and (suspected) exposure to covid-19: Secondary | ICD-10-CM | POA: Diagnosis not present

## 2023-04-02 DIAGNOSIS — M810 Age-related osteoporosis without current pathological fracture: Secondary | ICD-10-CM | POA: Diagnosis not present

## 2023-05-12 IMAGING — DX DG CHEST 2V
2 series · 2 of 2 positions shown · non-contrast
Comparison: None.

CLINICAL DATA: chronic cough

EXAM:
CHEST - 2 VIEW

[chest pa]
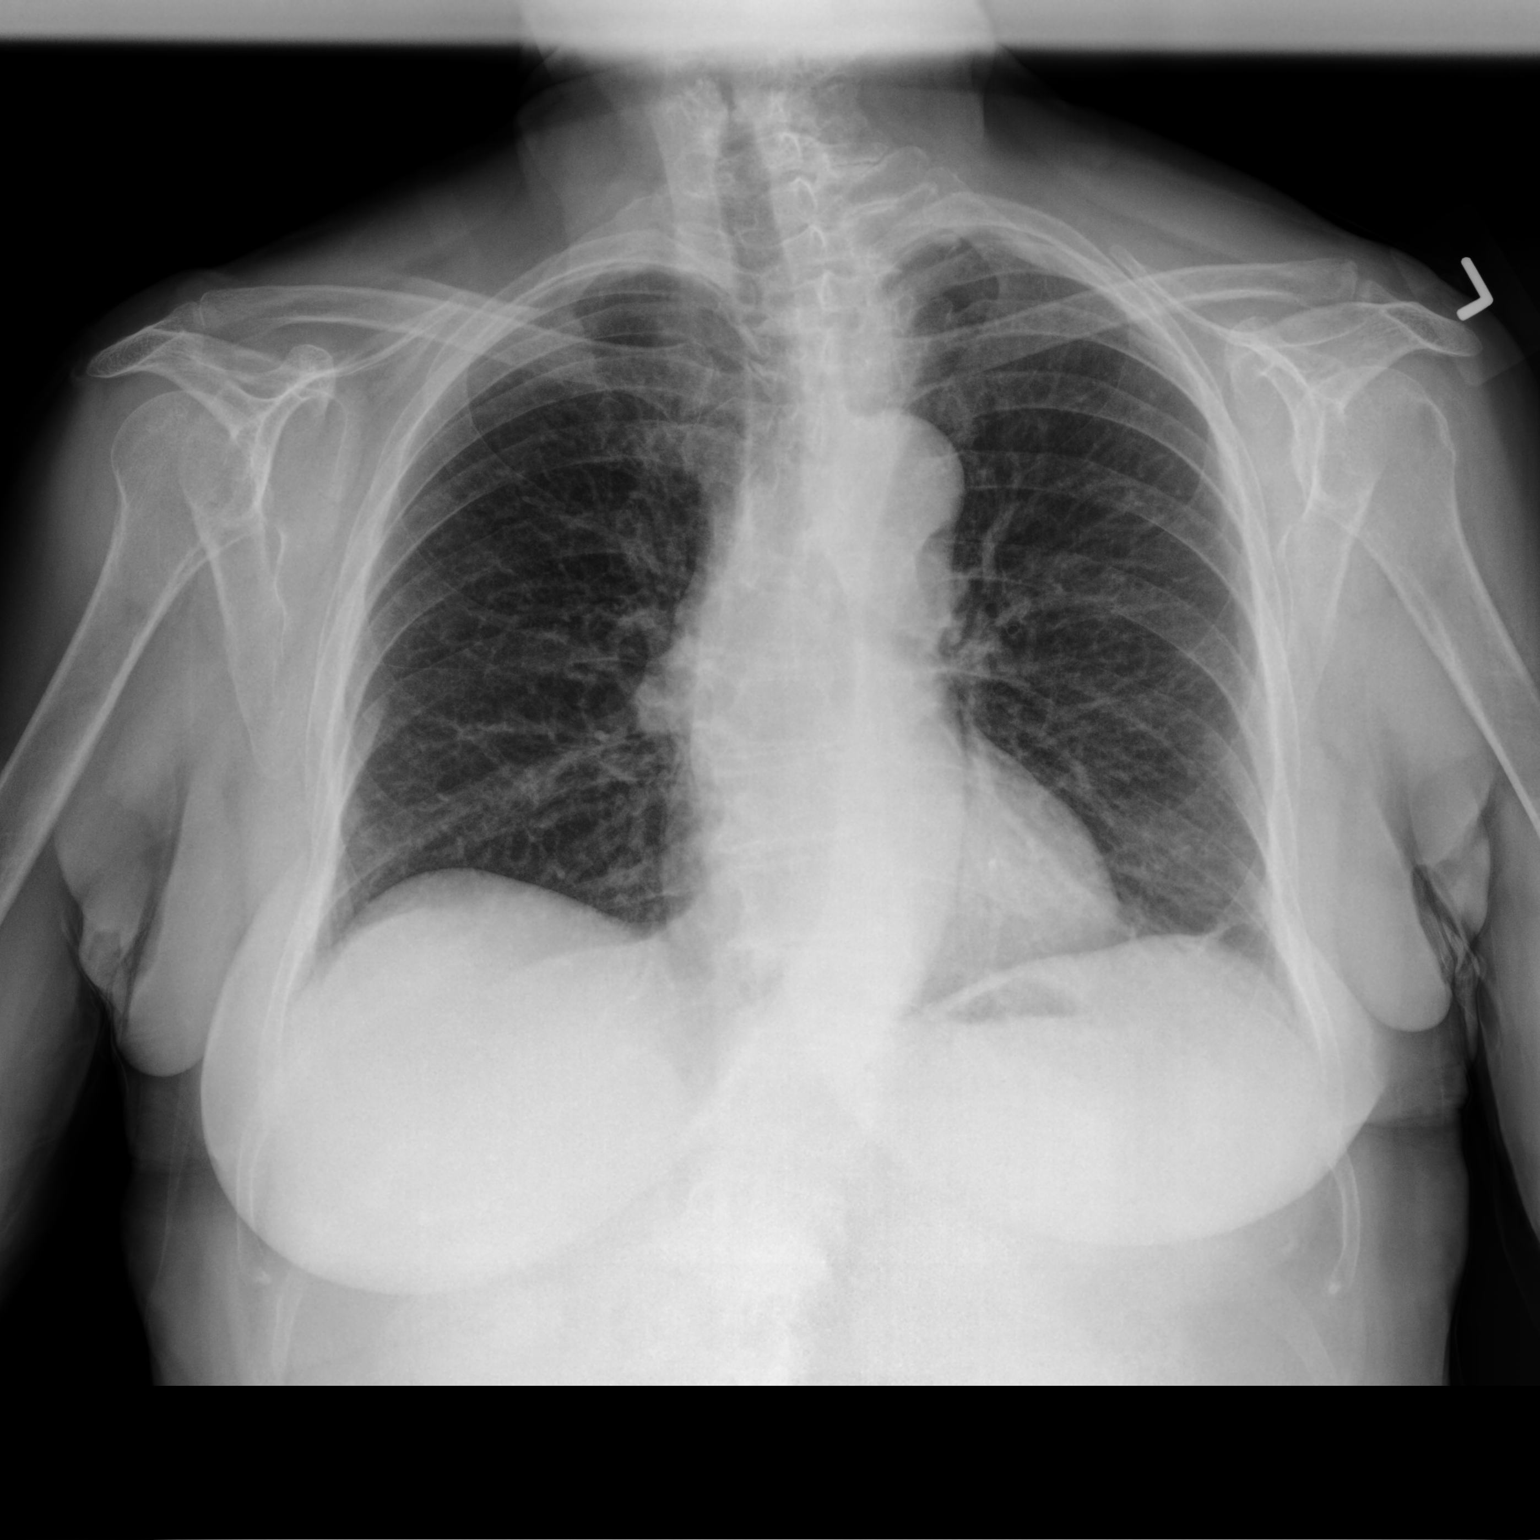

[chest lat]
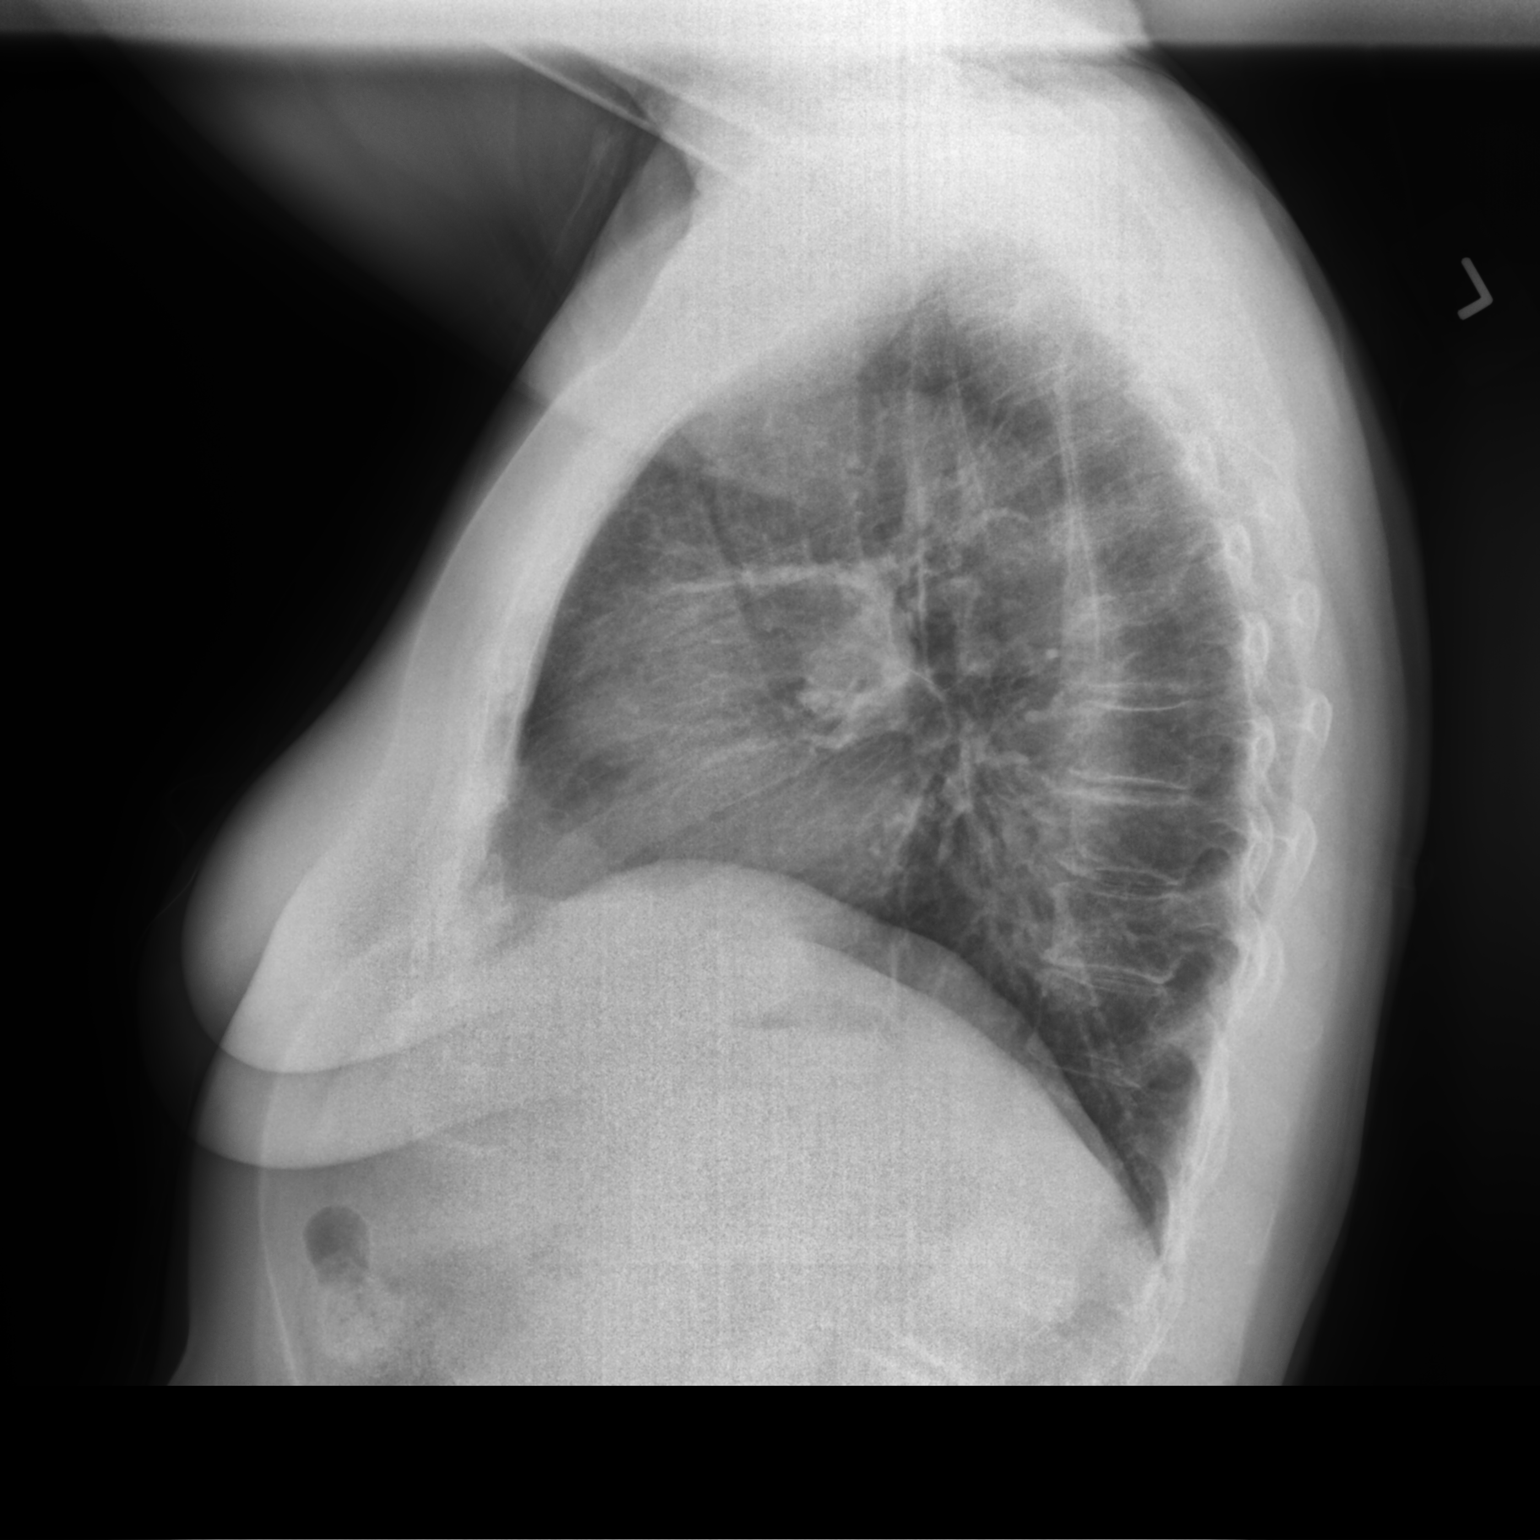

[2 of 2 positions shown; findings below may reference images not displayed]

FINDINGS: The heart is normal in size. There is a prominent convex contour at
the RIGHT hilum.Tortuous thoracic aorta. No pleural effusion. No
pneumothorax. Mild coarse peripheral reticulations and interstitial
prominence. Visualized abdomen is unremarkable. Multilevel
degenerative changes of the thoracic spine.
IMPRESSION: 1. Prominent RIGHT perihilar contour. Findings could reflect
summation artifact but underlying mass or lymphadenopathy remain in
the differential. Recommend further evaluation with dedicated
contrast enhanced chest CT.
2. Mild coarse reticulation peripherally could reflect underlying
interstitial lung disease. Recommend attention on follow-up.

These results will be called to the ordering clinician or
representative by the Radiologist Assistant, and communication
documented in the PACS or [REDACTED].

## 2023-05-28 DIAGNOSIS — E7800 Pure hypercholesterolemia, unspecified: Secondary | ICD-10-CM | POA: Diagnosis not present

## 2023-05-28 DIAGNOSIS — Z9181 History of falling: Secondary | ICD-10-CM | POA: Diagnosis not present

## 2023-05-28 DIAGNOSIS — Z23 Encounter for immunization: Secondary | ICD-10-CM | POA: Diagnosis not present

## 2023-05-28 DIAGNOSIS — N182 Chronic kidney disease, stage 2 (mild): Secondary | ICD-10-CM | POA: Diagnosis not present

## 2023-05-28 DIAGNOSIS — M810 Age-related osteoporosis without current pathological fracture: Secondary | ICD-10-CM | POA: Diagnosis not present

## 2023-05-28 DIAGNOSIS — Z1331 Encounter for screening for depression: Secondary | ICD-10-CM | POA: Diagnosis not present

## 2023-05-28 DIAGNOSIS — Z0000 Encounter for general adult medical examination without abnormal findings: Secondary | ICD-10-CM | POA: Diagnosis not present

## 2023-05-28 DIAGNOSIS — E041 Nontoxic single thyroid nodule: Secondary | ICD-10-CM | POA: Diagnosis not present

## 2023-05-28 DIAGNOSIS — I700 Atherosclerosis of aorta: Secondary | ICD-10-CM | POA: Diagnosis not present

## 2023-05-28 DIAGNOSIS — E1122 Type 2 diabetes mellitus with diabetic chronic kidney disease: Secondary | ICD-10-CM | POA: Diagnosis not present

## 2023-05-28 DIAGNOSIS — I10 Essential (primary) hypertension: Secondary | ICD-10-CM | POA: Diagnosis not present

## 2023-06-09 DIAGNOSIS — E042 Nontoxic multinodular goiter: Secondary | ICD-10-CM | POA: Diagnosis not present

## 2023-06-09 DIAGNOSIS — R590 Localized enlarged lymph nodes: Secondary | ICD-10-CM | POA: Diagnosis not present

## 2023-06-09 DIAGNOSIS — E041 Nontoxic single thyroid nodule: Secondary | ICD-10-CM | POA: Diagnosis not present

## 2023-06-10 DIAGNOSIS — Z1211 Encounter for screening for malignant neoplasm of colon: Secondary | ICD-10-CM | POA: Diagnosis not present

## 2023-06-10 DIAGNOSIS — Z1212 Encounter for screening for malignant neoplasm of rectum: Secondary | ICD-10-CM | POA: Diagnosis not present

## 2023-08-17 ENCOUNTER — Ambulatory Visit
Admission: RE | Admit: 2023-08-17 | Discharge: 2023-08-17 | Disposition: A | Discharge status: Home / Self Care | Payer: Medicare PPO | Source: Ambulatory Visit | Attending: Internal Medicine | Admitting: Internal Medicine

## 2023-08-17 ENCOUNTER — Other Ambulatory Visit: Payer: Self-pay | Admitting: Internal Medicine

## 2023-08-17 DIAGNOSIS — R195 Other fecal abnormalities: Secondary | ICD-10-CM | POA: Diagnosis not present

## 2023-08-17 DIAGNOSIS — Z1231 Encounter for screening mammogram for malignant neoplasm of breast: Secondary | ICD-10-CM | POA: Diagnosis not present

## 2023-08-17 DIAGNOSIS — Z83719 Family history of colon polyps, unspecified: Secondary | ICD-10-CM | POA: Diagnosis not present

## 2023-09-28 DIAGNOSIS — R195 Other fecal abnormalities: Secondary | ICD-10-CM | POA: Diagnosis not present

## 2023-09-28 DIAGNOSIS — K5730 Diverticulosis of large intestine without perforation or abscess without bleeding: Secondary | ICD-10-CM | POA: Diagnosis not present

## 2023-09-28 DIAGNOSIS — D123 Benign neoplasm of transverse colon: Secondary | ICD-10-CM | POA: Diagnosis not present

## 2023-09-28 DIAGNOSIS — D124 Benign neoplasm of descending colon: Secondary | ICD-10-CM | POA: Diagnosis not present

## 2023-09-28 DIAGNOSIS — D122 Benign neoplasm of ascending colon: Secondary | ICD-10-CM | POA: Diagnosis not present

## 2023-09-30 DIAGNOSIS — D124 Benign neoplasm of descending colon: Secondary | ICD-10-CM | POA: Diagnosis not present

## 2023-09-30 DIAGNOSIS — D123 Benign neoplasm of transverse colon: Secondary | ICD-10-CM | POA: Diagnosis not present

## 2023-09-30 DIAGNOSIS — D122 Benign neoplasm of ascending colon: Secondary | ICD-10-CM | POA: Diagnosis not present

## 2023-10-06 DIAGNOSIS — M810 Age-related osteoporosis without current pathological fracture: Secondary | ICD-10-CM | POA: Diagnosis not present

## 2023-10-21 DIAGNOSIS — R0981 Nasal congestion: Secondary | ICD-10-CM | POA: Diagnosis not present

## 2023-10-21 DIAGNOSIS — R059 Cough, unspecified: Secondary | ICD-10-CM | POA: Diagnosis not present

## 2023-11-23 DIAGNOSIS — H524 Presbyopia: Secondary | ICD-10-CM | POA: Diagnosis not present

## 2023-11-23 DIAGNOSIS — H43813 Vitreous degeneration, bilateral: Secondary | ICD-10-CM | POA: Diagnosis not present

## 2023-11-23 DIAGNOSIS — E119 Type 2 diabetes mellitus without complications: Secondary | ICD-10-CM | POA: Diagnosis not present

## 2023-11-23 DIAGNOSIS — H2513 Age-related nuclear cataract, bilateral: Secondary | ICD-10-CM | POA: Diagnosis not present

## 2023-12-01 DIAGNOSIS — I10 Essential (primary) hypertension: Secondary | ICD-10-CM | POA: Diagnosis not present

## 2023-12-01 DIAGNOSIS — E1122 Type 2 diabetes mellitus with diabetic chronic kidney disease: Secondary | ICD-10-CM | POA: Diagnosis not present

## 2023-12-01 DIAGNOSIS — N182 Chronic kidney disease, stage 2 (mild): Secondary | ICD-10-CM | POA: Diagnosis not present

## 2023-12-01 DIAGNOSIS — M810 Age-related osteoporosis without current pathological fracture: Secondary | ICD-10-CM | POA: Diagnosis not present

## 2023-12-01 DIAGNOSIS — E785 Hyperlipidemia, unspecified: Secondary | ICD-10-CM | POA: Diagnosis not present

## 2023-12-01 DIAGNOSIS — E041 Nontoxic single thyroid nodule: Secondary | ICD-10-CM | POA: Diagnosis not present

## 2023-12-01 DIAGNOSIS — I700 Atherosclerosis of aorta: Secondary | ICD-10-CM | POA: Diagnosis not present

## 2024-01-07 DIAGNOSIS — R0981 Nasal congestion: Secondary | ICD-10-CM | POA: Diagnosis not present

## 2024-01-07 DIAGNOSIS — R051 Acute cough: Secondary | ICD-10-CM | POA: Diagnosis not present

## 2024-01-14 IMAGING — DX DG CHEST 1V PORT
1 series · 1 of 1 positions shown · non-contrast
Comparison: Chest CT 01/27/2022 and earlier.

CLINICAL DATA: 76-year-old female with shortness of breath
overnight. No improvement with inhaler.

EXAM:
PORTABLE CHEST 1 VIEW

[chest ap]
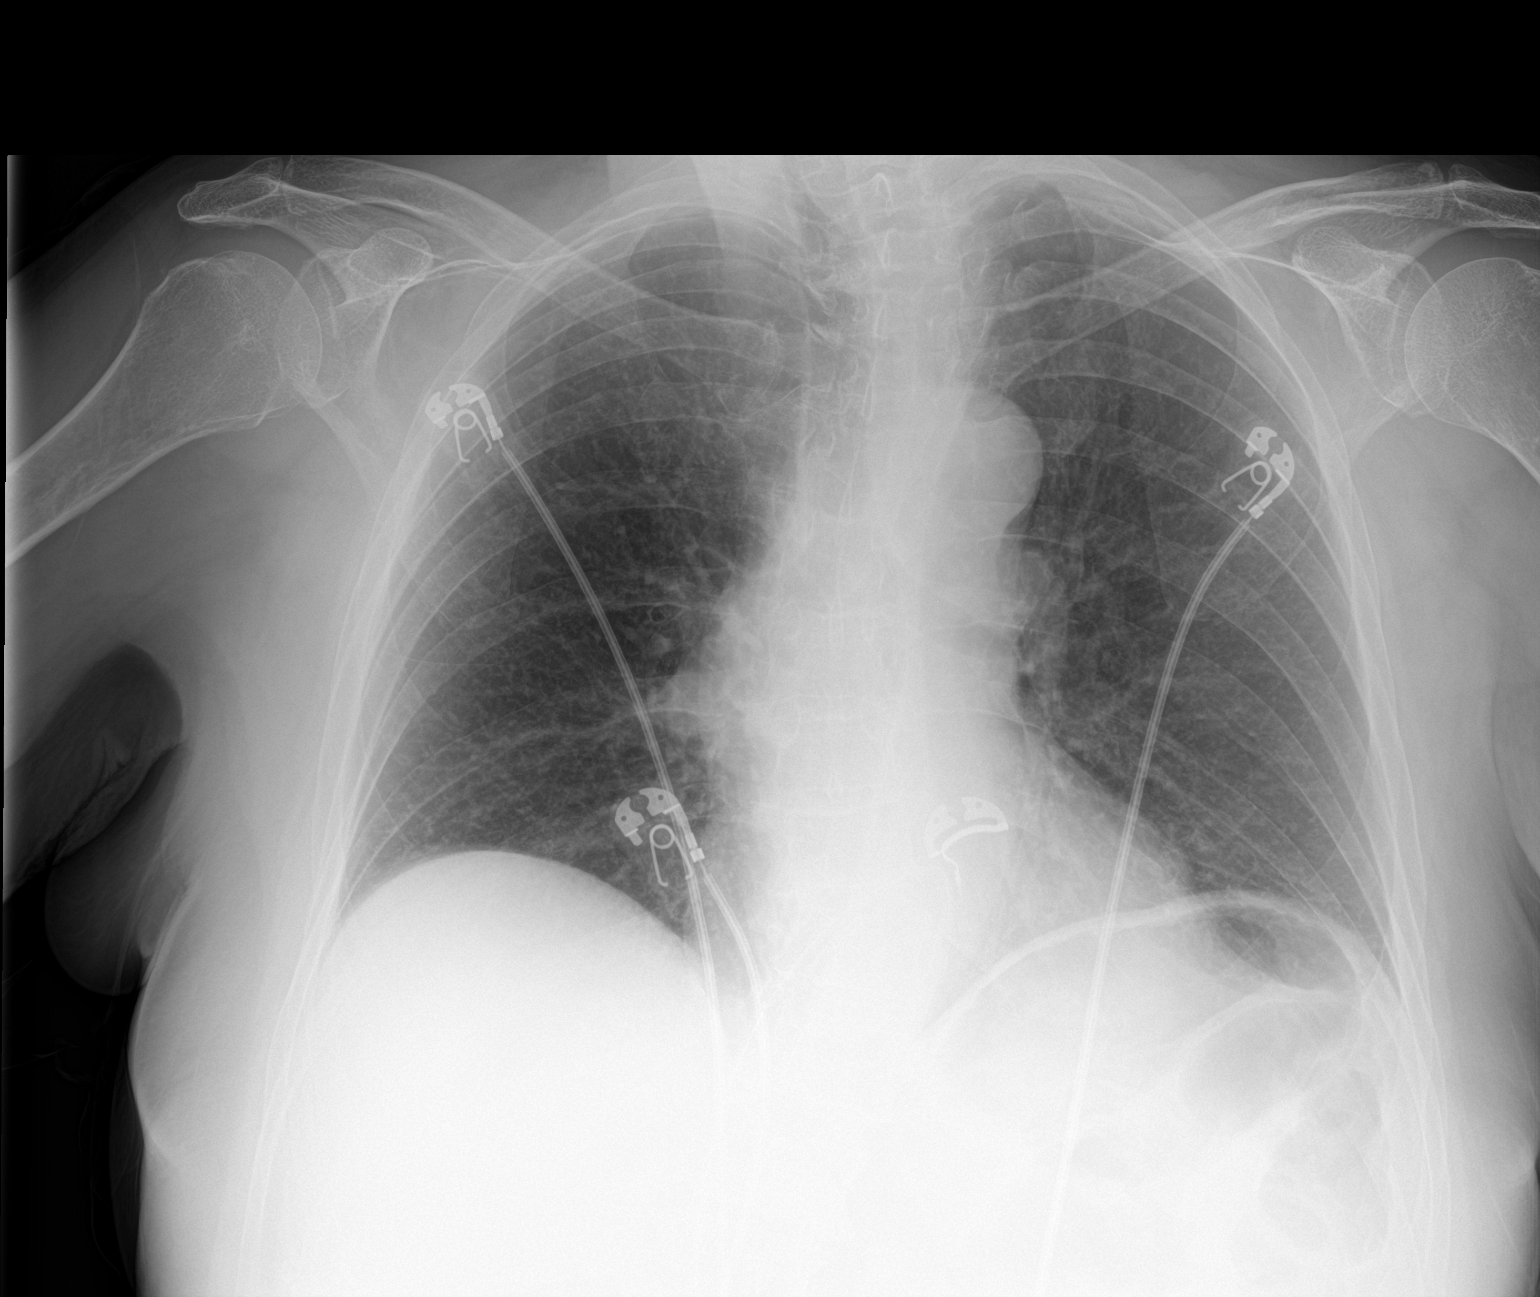

[1 of 1 positions shown; findings below may reference images not displayed]

FINDINGS: Portable AP semi upright view at 2187 hours. Mildly lower lung
volumes. Mediastinal contours are stable since last year and within
normal limits. Allowing for portable technique the lungs are clear.
No pneumothorax or pleural effusion. Scoliosis. No acute osseous
abnormality identified. Negative visible bowel gas.
IMPRESSION: No acute cardiopulmonary abnormality.

## 2024-03-27 DIAGNOSIS — R062 Wheezing: Secondary | ICD-10-CM | POA: Diagnosis not present

## 2024-03-27 DIAGNOSIS — R0981 Nasal congestion: Secondary | ICD-10-CM | POA: Diagnosis not present

## 2024-03-27 DIAGNOSIS — R051 Acute cough: Secondary | ICD-10-CM | POA: Diagnosis not present

## 2024-04-07 DIAGNOSIS — M810 Age-related osteoporosis without current pathological fracture: Secondary | ICD-10-CM | POA: Diagnosis not present

## 2024-06-01 DIAGNOSIS — I700 Atherosclerosis of aorta: Secondary | ICD-10-CM | POA: Diagnosis not present

## 2024-06-01 DIAGNOSIS — Z23 Encounter for immunization: Secondary | ICD-10-CM | POA: Diagnosis not present

## 2024-06-01 DIAGNOSIS — I10 Essential (primary) hypertension: Secondary | ICD-10-CM | POA: Diagnosis not present

## 2024-06-01 DIAGNOSIS — M810 Age-related osteoporosis without current pathological fracture: Secondary | ICD-10-CM | POA: Diagnosis not present

## 2024-06-01 DIAGNOSIS — E1122 Type 2 diabetes mellitus with diabetic chronic kidney disease: Secondary | ICD-10-CM | POA: Diagnosis not present

## 2024-06-01 DIAGNOSIS — E7800 Pure hypercholesterolemia, unspecified: Secondary | ICD-10-CM | POA: Diagnosis not present

## 2024-06-01 DIAGNOSIS — N200 Calculus of kidney: Secondary | ICD-10-CM | POA: Diagnosis not present

## 2024-06-01 DIAGNOSIS — Z1331 Encounter for screening for depression: Secondary | ICD-10-CM | POA: Diagnosis not present

## 2024-06-01 DIAGNOSIS — N182 Chronic kidney disease, stage 2 (mild): Secondary | ICD-10-CM | POA: Diagnosis not present

## 2024-06-01 DIAGNOSIS — E041 Nontoxic single thyroid nodule: Secondary | ICD-10-CM | POA: Diagnosis not present

## 2024-06-01 DIAGNOSIS — Z0000 Encounter for general adult medical examination without abnormal findings: Secondary | ICD-10-CM | POA: Diagnosis not present
